# Patient Record
Sex: Male | Born: 1950
Health system: Southern US, Community
[De-identification: ages and names within clinical notes are randomized; demographics above are authoritative.]

## PROBLEM LIST (undated history)

## (undated) DIAGNOSIS — K219 Gastro-esophageal reflux disease without esophagitis: Secondary | ICD-10-CM

## (undated) DIAGNOSIS — E785 Hyperlipidemia, unspecified: Secondary | ICD-10-CM

## (undated) HISTORY — PX: OTHER SURGICAL HISTORY: SHX169

## (undated) HISTORY — PX: ANTERIOR CRUCIATE LIGAMENT REPAIR: SHX115

## (undated) HISTORY — PX: ABDOMINAL SURGERY: SHX537

## (undated) HISTORY — PX: KNEE ARTHROSCOPY: SHX127

---

## 2009-07-28 ENCOUNTER — Ambulatory Visit: Admission: RE | Admit: 2009-07-28 | Discharge: 2009-07-28 | Payer: Self-pay | Admitting: Orthopedic Surgery

## 2009-07-28 ENCOUNTER — Encounter (INDEPENDENT_AMBULATORY_CARE_PROVIDER_SITE_OTHER): Payer: Self-pay | Admitting: Orthopedic Surgery

## 2009-07-28 ENCOUNTER — Ambulatory Visit: Payer: Self-pay | Admitting: Vascular Surgery

## 2013-01-03 ENCOUNTER — Ambulatory Visit (INDEPENDENT_AMBULATORY_CARE_PROVIDER_SITE_OTHER): Payer: BC Managed Care – PPO | Admitting: Surgery

## 2013-01-03 ENCOUNTER — Encounter (INDEPENDENT_AMBULATORY_CARE_PROVIDER_SITE_OTHER): Payer: Self-pay | Admitting: Surgery

## 2013-01-03 VITALS — BP 130/78 | HR 52 | Temp 98.3°F | Resp 14 | Ht 71.0 in | Wt 168.8 lb

## 2013-01-03 DIAGNOSIS — K4090 Unilateral inguinal hernia, without obstruction or gangrene, not specified as recurrent: Secondary | ICD-10-CM | POA: Insufficient documentation

## 2013-01-03 NOTE — Patient Instructions (Signed)

## 2013-01-03 NOTE — Progress Notes (Signed)
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  He said that he had A. Boles down near when he was in high school and they fix something on his bowel. The incision is Consistent with a right inguinal hernia incision. Examination suggested a broad-based right direct inguinal hernia. He had a traumatic soccer injury and infarcted his left testicle. This was in a soccer game.  I discussed laparoscopic as well as open inguinal hernia repair with him. 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  He needs to schedule this at a time when it's not busy for him and he was to get it done in August.  History reviewed. No pertinent past medical history.  Past Surgical History  Procedure Laterality Date  . Knee arthroscopy      rt  . Abdominal surgery      Current Outpatient Prescriptions  Medication Sig Dispense Refill  . atorvastatin (LIPITOR) 20 MG tablet Take 20 mg by mouth daily.      Marland Kitchen omeprazole (PRILOSEC) 20 MG capsule Take 20 mg by mouth daily.      . vitamin B-12 (CYANOCOBALAMIN) 1000 MCG tablet Take 1,000 mcg by mouth daily.       No current facility-administered medications for this visit.   Review of patient's allergies indicates not on file. History reviewed. No pertinent family history. Social History:   reports that he has never smoked. He does not have any smokeless tobacco history on file. He reports that he drinks about 0.6 ounces of alcohol per week. He reports that he does not use illicit drugs.   REVIEW OF SYSTEMS - PERTINENT POSITIVES ONLY: Noncontributory`  Physical Exam:   There were no vitals taken for this visit. There is no height or weight on file to calculate BMI.  Gen:  WDWN WM NAD  Neurological: Alert and oriented to person, place, and time. Motor and sensory function is grossly  intact  Head: Normocephalic and atraumatic.  Eyes: Conjunctivae are normal. Pupils are equal, round, and reactive to light. No scleral icterus.  Neck: Normal range of motion. Neck supple. No tracheal deviation or thyromegaly present.  Cardiovascular:  SR without murmurs or gallops.  No carotid bruits Respiratory: Effort normal.  No respiratory distress. No chest wall tenderness. Breath sounds normal.  No wheezes, rales or rhonchi.  Abdomen:  RIH recurrent;  No LIH;  Atrophic left testis GU: Musculoskeletal: Normal range of motion. Extremities are nontender. No cyanosis, edema or clubbing noted Lymphadenopathy: No cervical, preauricular, postauricular or axillary adenopathy is present Skin: Skin is warm and dry. No rash noted. No diaphoresis. No erythema. No pallor. Pscyh: Normal mood and affect. Behavior is normal. Judgment and thought content normal.   LABORATORY RESULTS: No results found for this or any previous visit (from the past 48 hour(s)).  RADIOLOGY RESULTS: No results found.  Problem List: There are no active problems to display for this patient.   Assessment & Plan: Recurrent RIH;  Plan open right inguinal hernia repair at [facility] B. Daphine Deutscher, MD, [facility] [phone] beeper [phone]  01/03/2013 4:20 PM

## 2013-03-14 ENCOUNTER — Encounter (HOSPITAL_COMMUNITY): Payer: Self-pay | Admitting: Pharmacy Technician

## 2013-03-19 NOTE — Patient Instructions (Signed)
Matthew Becker  03/19/2013   Your procedure is scheduled on:  03/23/13              Surgery 0730am-0905am  Report to [facility] at     0530  AM.  Call this number if you have problems the morning of surgery: [phone]   Remember: Fleets enema nite before surgery.     Do not eat food or drink liquids after midnight.   Take these medicines the morning of surgery with A SIP OF WATER:    Do not wear jewelry,   Do not wear lotions, powders, or perfumes. .   Men may shave face and neck.  Do not bring valuables to the hospital.  Contacts, dentures or bridgework may not be worn into surgery.     Patients discharged the day of surgery will not be allowed to drive  home.  Name and phone number of your driver:    SEE CHG INSTRUCTION SHEET    Please read over the following fact sheets that you were given: , coughing and deep breathing exercises, leg exercises               Failure to comply with these instructions may result in cancellation of your surgery.                Patient Signature ____________________________              Nurse Signature _____________________________

## 2013-03-20 ENCOUNTER — Encounter (HOSPITAL_COMMUNITY): Payer: Self-pay

## 2013-03-20 ENCOUNTER — Encounter (HOSPITAL_COMMUNITY)
Admission: RE | Admit: 2013-03-20 | Discharge: 2013-03-20 | Disposition: A | Discharge status: Home / Self Care | Payer: BC Managed Care – PPO | Source: Ambulatory Visit | Attending: Surgery | Admitting: Surgery

## 2013-03-20 HISTORY — DX: Gastro-esophageal reflux disease without esophagitis: K21.9

## 2013-03-20 LAB — CBC
Hemoglobin: 15.9 g/dL (ref 13.0–17.0)
MCH: 33.2 pg (ref 26.0–34.0)
MCHC: 34.6 g/dL (ref 30.0–36.0)
Platelets: 199 10*3/uL (ref 150–400)
RDW: 12.2 % (ref 11.5–15.5)

## 2013-03-22 NOTE — H&P (Signed)
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 He said that he had a bulge down near there  when he was in high school and they fixed something on his bowel. The incision is consistent with a right inguinal hernia incision. Examination suggested a broad-based right direct inguinal hernia. He had a traumatic soccer injury and infarcted his left testicle. This was in a soccer game.  I discussed laparoscopic as well as open inguinal hernia repair with him. 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 He needs to schedule this at a time when it's not busy for him and he was to get it done in August.  History reviewed. No pertinent past medical history.  Past Surgical History   Procedure  Laterality  Date   .  Knee arthroscopy       rt   .  Abdominal surgery      Current Outpatient Prescriptions   Medication  Sig  Dispense  Refill   .  atorvastatin (LIPITOR) 20 MG tablet  Take 20 mg by mouth daily.     Marland Kitchen  omeprazole (PRILOSEC) 20 MG capsule  Take 20 mg by mouth daily.     .  vitamin B-12 (CYANOCOBALAMIN) 1000 MCG tablet  Take 1,000 mcg by mouth daily.      No current facility-administered medications for this visit.   Review of patient's allergies indicates not on file.  History reviewed. No pertinent family history.  Social History: reports that he has never smoked. He does not have any smokeless tobacco history on file. He reports that he drinks about 0.6 ounces of alcohol per week. He reports that he does not use illicit drugs.  REVIEW OF SYSTEMS - PERTINENT POSITIVES ONLY:  Noncontributory`  Physical Exam:  There were no vitals taken for this visit.  There is no height or weight on file to calculate BMI.  Gen: WDWN WM NAD  Neurological: Alert and oriented to person, place, and time. Motor and sensory function is  grossly intact  Head: Normocephalic and atraumatic.  Eyes: Conjunctivae are normal. Pupils are equal, round, and reactive to light. No scleral icterus.  Neck: Normal range of motion. Neck supple. No tracheal deviation or thyromegaly present.  Cardiovascular: SR without murmurs or gallops. No carotid bruits  Respiratory: Effort normal. No respiratory distress. No chest wall tenderness. Breath sounds normal. No wheezes, rales or rhonchi.  Abdomen: RIH recurrent; No LIH; Atrophic left testis  GU:  Musculoskeletal: Normal range of motion. Extremities are nontender. No cyanosis, edema or clubbing noted Lymphadenopathy: No cervical, preauricular, postauricular or axillary adenopathy is present Skin: Skin is warm and dry. No rash noted. No diaphoresis. No erythema. No pallor. Pscyh: Normal mood and affect. Behavior is normal. Judgment and thought content normal.  LABORATORY RESULTS:  No results found for this or any previous visit (from the past 48 hour(s)).  RADIOLOGY RESULTS:  No results found.  Problem List:  There are no active problems to display for this patient.  Assessment & Plan:  Recurrent RIH; Plan open right inguinal hernia repair at [facility] B. Daphine Deutscher, MD, [facility]  [phone] beeper  [phone]

## 2013-03-23 ENCOUNTER — Encounter (HOSPITAL_COMMUNITY)
Admission: RE | Disposition: A | Discharge status: Home / Self Care | Payer: Self-pay | Source: Ambulatory Visit | Attending: Surgery

## 2013-03-23 ENCOUNTER — Ambulatory Visit (HOSPITAL_COMMUNITY)
Admission: RE | Admit: 2013-03-23 | Discharge: 2013-03-23 | Disposition: A | Discharge status: Home / Self Care | Payer: BC Managed Care – PPO | Source: Ambulatory Visit | Attending: Surgery | Admitting: Surgery

## 2013-03-23 ENCOUNTER — Encounter (HOSPITAL_COMMUNITY): Payer: Self-pay | Admitting: Anesthesiology

## 2013-03-23 ENCOUNTER — Ambulatory Visit (HOSPITAL_COMMUNITY): Payer: BC Managed Care – PPO | Admitting: Anesthesiology

## 2013-03-23 ENCOUNTER — Encounter (HOSPITAL_COMMUNITY): Payer: Self-pay | Admitting: *Deleted

## 2013-03-23 DIAGNOSIS — K4090 Unilateral inguinal hernia, without obstruction or gangrene, not specified as recurrent: Secondary | ICD-10-CM

## 2013-03-23 DIAGNOSIS — K219 Gastro-esophageal reflux disease without esophagitis: Secondary | ICD-10-CM | POA: Insufficient documentation

## 2013-03-23 DIAGNOSIS — K4091 Unilateral inguinal hernia, without obstruction or gangrene, recurrent: Secondary | ICD-10-CM | POA: Insufficient documentation

## 2013-03-23 DIAGNOSIS — Z79899 Other long term (current) drug therapy: Secondary | ICD-10-CM | POA: Insufficient documentation

## 2013-03-23 HISTORY — PX: INGUINAL HERNIA REPAIR: SHX194

## 2013-03-23 SURGERY — REPAIR, HERNIA, INGUINAL, ADULT
Anesthesia: General | Site: Groin | Laterality: Right | Wound class: Clean

## 2013-03-23 MED ORDER — LACTATED RINGERS IV SOLN: INTRAVENOUS | Status: DC

## 2013-03-23 MED ORDER — GLYCOPYRROLATE 0.2 MG/ML IJ SOLN
INTRAMUSCULAR | Status: DC | PRN
  Administered 2013-03-23: 0.4 mg via INTRAVENOUS

## 2013-03-23 MED ORDER — KETAMINE HCL 10 MG/ML IJ SOLN
INTRAMUSCULAR | Status: DC | PRN
  Administered 2013-03-23: 10 mg via INTRAVENOUS

## 2013-03-23 MED ORDER — ONDANSETRON HCL 4 MG/2ML IJ SOLN: 4.0000 mg | Freq: Four times a day (QID) | INTRAMUSCULAR | Status: DC | PRN

## 2013-03-23 MED ORDER — CHLORHEXIDINE GLUCONATE 4 % EX LIQD
1.0000 "application " | Freq: Once | CUTANEOUS | Status: DC
  Filled 2013-03-23: qty 15

## 2013-03-23 MED ORDER — PROMETHAZINE HCL 25 MG/ML IJ SOLN: 6.2500 mg | INTRAMUSCULAR | Status: DC | PRN

## 2013-03-23 MED ORDER — DEXAMETHASONE SODIUM PHOSPHATE 4 MG/ML IJ SOLN
INTRAMUSCULAR | Status: DC | PRN
  Administered 2013-03-23: 8 mg via INTRAVENOUS

## 2013-03-23 MED ORDER — FENTANYL CITRATE 0.05 MG/ML IJ SOLN
INTRAMUSCULAR | Status: DC | PRN
  Administered 2013-03-23: 75 ug via INTRAVENOUS
  Administered 2013-03-23: 50 ug via INTRAVENOUS
  Administered 2013-03-23 (×2): 25 ug via INTRAVENOUS

## 2013-03-23 MED ORDER — BUPIVACAINE LIPOSOME 1.3 % IJ SUSP
20.0000 mL | Freq: Once | INTRAMUSCULAR | Status: DC
  Filled 2013-03-23: qty 20

## 2013-03-23 MED ORDER — OXYCODONE HCL 5 MG PO TABS: 5.0000 mg | ORAL_TABLET | ORAL | Status: DC | PRN

## 2013-03-23 MED ORDER — CEFAZOLIN SODIUM-DEXTROSE 2-3 GM-% IV SOLR: 2.0000 g | INTRAVENOUS | Status: DC

## 2013-03-23 MED ORDER — SODIUM CHLORIDE 0.9 % IV SOLN: 250.0000 mL | INTRAVENOUS | Status: DC | PRN

## 2013-03-23 MED ORDER — FENTANYL CITRATE 0.05 MG/ML IJ SOLN: 25.0000 ug | INTRAMUSCULAR | Status: DC | PRN

## 2013-03-23 MED ORDER — MEPERIDINE HCL 50 MG/ML IJ SOLN: 6.2500 mg | INTRAMUSCULAR | Status: DC | PRN

## 2013-03-23 MED ORDER — SUCCINYLCHOLINE CHLORIDE 20 MG/ML IJ SOLN
INTRAMUSCULAR | Status: DC | PRN
  Administered 2013-03-23: 100 mg via INTRAVENOUS

## 2013-03-23 MED ORDER — CISATRACURIUM BESYLATE (PF) 10 MG/5ML IV SOLN
INTRAVENOUS | Status: DC | PRN
  Administered 2013-03-23: 8 mg via INTRAVENOUS

## 2013-03-23 MED ORDER — MIDAZOLAM HCL 5 MG/5ML IJ SOLN
INTRAMUSCULAR | Status: DC | PRN
  Administered 2013-03-23: 1 mg via INTRAVENOUS

## 2013-03-23 MED ORDER — ACETAMINOPHEN 650 MG RE SUPP
650.0000 mg | RECTAL | Status: DC | PRN
  Filled 2013-03-23: qty 1

## 2013-03-23 MED ORDER — LACTATED RINGERS IV SOLN
INTRAVENOUS | Status: DC | PRN
  Administered 2013-03-23 (×2): via INTRAVENOUS

## 2013-03-23 MED ORDER — OXYCODONE-ACETAMINOPHEN 5-325 MG PO TABS: 1.0000 | ORAL_TABLET | ORAL | Status: DC | PRN

## 2013-03-23 MED ORDER — CEFAZOLIN SODIUM-DEXTROSE 2-3 GM-% IV SOLR
INTRAVENOUS | Status: AC
  Filled 2013-03-23: qty 50

## 2013-03-23 MED ORDER — HEPARIN SODIUM (PORCINE) 5000 UNIT/ML IJ SOLN
5000.0000 [IU] | Freq: Once | INTRAMUSCULAR | Status: AC
  Administered 2013-03-23: 5000 [IU] via SUBCUTANEOUS
  Filled 2013-03-23: qty 1

## 2013-03-23 MED ORDER — ONDANSETRON HCL 4 MG/2ML IJ SOLN
INTRAMUSCULAR | Status: DC | PRN
  Administered 2013-03-23 (×2): 2 mg via INTRAVENOUS

## 2013-03-23 MED ORDER — NEOSTIGMINE METHYLSULFATE 1 MG/ML IJ SOLN
INTRAMUSCULAR | Status: DC | PRN
  Administered 2013-03-23: 3 mg via INTRAVENOUS

## 2013-03-23 MED ORDER — PROPOFOL INFUSION 10 MG/ML OPTIME
INTRAVENOUS | Status: DC | PRN
  Administered 2013-03-23: 10 mL via INTRAVENOUS

## 2013-03-23 MED ORDER — PROPOFOL 10 MG/ML IV BOLUS
INTRAVENOUS | Status: DC | PRN
  Administered 2013-03-23: 175 mg via INTRAVENOUS

## 2013-03-23 MED ORDER — LIDOCAINE HCL (CARDIAC) 20 MG/ML IV SOLN
INTRAVENOUS | Status: DC | PRN
  Administered 2013-03-23: 30 mg via INTRAVENOUS

## 2013-03-23 MED ORDER — SODIUM CHLORIDE 0.9 % IJ SOLN: 3.0000 mL | INTRAMUSCULAR | Status: DC | PRN

## 2013-03-23 MED ORDER — BUPIVACAINE LIPOSOME 1.3 % IJ SUSP
INTRAMUSCULAR | Status: DC | PRN
  Administered 2013-03-23: 20 mL

## 2013-03-23 MED ORDER — ACETAMINOPHEN 325 MG PO TABS: 650.0000 mg | ORAL_TABLET | ORAL | Status: DC | PRN

## 2013-03-23 MED ORDER — EPHEDRINE SULFATE 50 MG/ML IJ SOLN
INTRAMUSCULAR | Status: DC | PRN
  Administered 2013-03-23: 10 mg via INTRAVENOUS

## 2013-03-23 MED ORDER — SODIUM CHLORIDE 0.9 % IJ SOLN: 3.0000 mL | Freq: Two times a day (BID) | INTRAMUSCULAR | Status: DC

## 2013-03-23 SURGICAL SUPPLY — 42 items
BENZOIN TINCTURE PRP APPL 2/3 (GAUZE/BANDAGES/DRESSINGS) ×2 IMPLANT
BLADE HEX COATED 2.75 (ELECTRODE) ×2 IMPLANT
BLADE SURG 15 STRL LF DISP TIS (BLADE) ×1 IMPLANT
BLADE SURG 15 STRL SS (BLADE) ×1
BLADE SURG SZ10 CARB STEEL (BLADE) ×2 IMPLANT
CANISTER SUCTION 2500CC (MISCELLANEOUS) ×2 IMPLANT
CLOTH BEACON ORANGE TIMEOUT ST (SAFETY) ×2 IMPLANT
DECANTER SPIKE VIAL GLASS SM (MISCELLANEOUS) ×2 IMPLANT
DERMABOND ADVANCED (GAUZE/BANDAGES/DRESSINGS) ×1
DERMABOND ADVANCED .7 DNX12 (GAUZE/BANDAGES/DRESSINGS) ×1 IMPLANT
DISSECTOR ROUND CHERRY 3/8 STR (MISCELLANEOUS) ×2 IMPLANT
DRAIN PENROSE 18X1/2 LTX STRL (DRAIN) ×2 IMPLANT
DRAPE LAPAROTOMY TRNSV 102X78 (DRAPE) ×2 IMPLANT
DRSG TEGADERM 2-3/8X2-3/4 SM (GAUZE/BANDAGES/DRESSINGS) IMPLANT
ELECT REM PT RETURN 9FT ADLT (ELECTROSURGICAL) ×2
ELECTRODE REM PT RTRN 9FT ADLT (ELECTROSURGICAL) ×1 IMPLANT
GAUZE SPONGE 4X4 16PLY XRAY LF (GAUZE/BANDAGES/DRESSINGS) IMPLANT
GLOVE BIOGEL M 8.0 STRL (GLOVE) ×2 IMPLANT
GLOVE BIOGEL PI IND STRL 7.0 (GLOVE) ×1 IMPLANT
GLOVE BIOGEL PI INDICATOR 7.0 (GLOVE) ×1
GOWN STRL NON-REIN LRG LVL3 (GOWN DISPOSABLE) ×2 IMPLANT
GOWN STRL REIN XL XLG (GOWN DISPOSABLE) ×4 IMPLANT
KIT BASIN OR (CUSTOM PROCEDURE TRAY) ×2 IMPLANT
MESH HERNIA 3X6 (Mesh General) ×2 IMPLANT
NEEDLE HYPO 25X1 1.5 SAFETY (NEEDLE) ×2 IMPLANT
NS IRRIG 1000ML POUR BTL (IV SOLUTION) ×2 IMPLANT
PACK BASIC VI WITH GOWN DISP (CUSTOM PROCEDURE TRAY) ×2 IMPLANT
PENCIL BUTTON HOLSTER BLD 10FT (ELECTRODE) ×2 IMPLANT
SPONGE GAUZE 4X4 12PLY (GAUZE/BANDAGES/DRESSINGS) IMPLANT
SPONGE LAP 4X18 X RAY DECT (DISPOSABLE) ×4 IMPLANT
STAPLER VISISTAT 35W (STAPLE) IMPLANT
STRIP CLOSURE SKIN 1/2X4 (GAUZE/BANDAGES/DRESSINGS) ×2 IMPLANT
SUT PROLENE 2 0 CT2 30 (SUTURE) ×4 IMPLANT
SUT SILK 2 0 SH (SUTURE) ×2 IMPLANT
SUT SILK 2 0 SH CR/8 (SUTURE) IMPLANT
SUT SURGILON 0 BLK (SUTURE) IMPLANT
SUT VIC AB 2-0 SH 27 (SUTURE) ×2
SUT VIC AB 2-0 SH 27X BRD (SUTURE) ×2 IMPLANT
SUT VIC AB 4-0 SH 18 (SUTURE) ×2 IMPLANT
SYR BULB IRRIGATION 50ML (SYRINGE) ×2 IMPLANT
SYR CONTROL 10ML LL (SYRINGE) ×2 IMPLANT
YANKAUER SUCT BULB TIP 10FT TU (MISCELLANEOUS) ×2 IMPLANT

## 2013-03-23 NOTE — Anesthesia Preprocedure Evaluation (Addendum)
Anesthesia Evaluation  Patient identified by MRN, date of birth, ID band Patient awake    Reviewed: Allergy & Precautions, H&P , NPO status , Patient's Chart, lab work & pertinent test results  Airway Mallampati: II TM Distance: >3 FB Neck ROM: Full    Dental no notable dental hx.    Pulmonary neg pulmonary ROS,  breath sounds clear to auscultation  Pulmonary exam normal       Cardiovascular negative cardio ROS  Rhythm:Regular Rate:Normal     Neuro/Psych negative neurological ROS  negative psych ROS   GI/Hepatic Neg liver ROS, GERD-  Medicated and Controlled,  Endo/Other  negative endocrine ROS  Renal/GU negative Renal ROS  negative genitourinary   Musculoskeletal negative musculoskeletal ROS (+)   Abdominal   Peds negative pediatric ROS (+)  Hematology negative hematology ROS (+)   Anesthesia Other Findings   Reproductive/Obstetrics negative OB ROS                           Anesthesia Physical Anesthesia Plan  ASA: I  Anesthesia Plan: General   Post-op Pain Management:    Induction: Intravenous  Airway Management Planned: LMA and Oral ETT  Additional Equipment:   Intra-op Plan:   Post-operative Plan: Extubation in OR  Informed Consent: I have reviewed the patients History and Physical, chart, labs and discussed the procedure including the risks, benefits and alternatives for the proposed anesthesia with the patient or authorized representative who has indicated his/her understanding and acceptance.   Dental advisory given  Plan Discussed with: CRNA  Anesthesia Plan Comments:         Anesthesia Quick Evaluation

## 2013-03-23 NOTE — Interval H&P Note (Signed)
History and Physical Interval Note:  03/23/2013 7:30 AM  Matthew Becker  has presented today for surgery, with the diagnosis of  right inguinal hernia  The various methods of treatment have been discussed with the patient and family. After consideration of risks, benefits and other options for treatment, the patient has consented to  Procedure(s): HERNIA REPAIR INGUINAL ADULT (Right) as a surgical intervention .  The patient's history has been reviewed, patient examined, no change in status, stable for surgery.  I have reviewed the patient's chart and labs.  Questions were answered to the patient's satisfaction.     Lakecia Deschamps B

## 2013-03-23 NOTE — Transfer of Care (Signed)
Immediate Anesthesia Transfer of Care Note  Patient: Matthew Becker  Procedure(s) Performed: Procedure(s): HERNIA REPAIR INGUINAL ADULT (Right)  Patient Location: PACU  Anesthesia Type:General  Level of Consciousness: awake, alert , oriented and patient cooperative  Airway & Oxygen Therapy: Patient Spontanous Breathing and Patient connected to nasal cannula oxygen  Post-op Assessment: Report given to PACU RN and Post -op Vital signs reviewed and stable  Post vital signs: stable  Complications: No apparent anesthesia complications

## 2013-03-23 NOTE — Op Note (Signed)
Surgeon: Wenda Low, MD, FACS  Asst:  none  Anes:  general  Procedure: Open right inguinal hernia repair with marlex type mesh repair of the floor  Diagnosis: Large right direct inguinal hernia  Complications: none  EBL:   minimal cc  Description of Procedure:  The patient was taken to room 11 at [facility] and given general anesthesia.  The patient had a recurrent RIH and this side was marked and after prepping with PCMX, a timeout was performed and the medial portion of the old incision was excised.  The dissection was carried down to the scarred external fascia which appeared to have been closed (not a Halstead II repair).  The hernia was bulging out of the external ring.  The fascia was mobilized and opened along the fibers to enable the cord vessels and vas to be separated from the hernia.  A narrow neck lipoma of the cord was ligated and excised.  The bulging direct hernia was tacked to reduce its presence in the floor and this enabled me to cut a piece of marlex type mesh to fit the defect and sew it with 2-0 prolene along the inguinal ligament and medially along the fascia of the internal oblique muscle.  The mesh was split to go around the cord structures and a single horizontal mattress suture was used to tack the two limbs of mesh together and tuck them beneath the external oblique.  Exparel 20 cc was injected beneath the fascia and then the ext oblique was closed with running 2-0 vicryl.  The incision was then closed in layers with 4-0 vicryl and 5-0 monocryl and Dermabond.    Matt B. Daphine Deutscher, MD, [facility], [place] [phone]

## 2013-03-23 NOTE — Anesthesia Postprocedure Evaluation (Signed)
  Anesthesia Post-op Note  Patient: Matthew Becker  Procedure(s) Performed: Procedure(s) (LRB): HERNIA REPAIR INGUINAL ADULT (Right)  Patient Location: PACU  Anesthesia Type: General  Level of Consciousness: awake and alert   Airway and Oxygen Therapy: Patient Spontanous Breathing  Post-op Pain: mild  Post-op Assessment: Post-op Vital signs reviewed, Patient's Cardiovascular Status Stable, Respiratory Function Stable, Patent Airway and No signs of Nausea or vomiting  Last Vitals:  Filed Vitals:   03/23/13 1200  BP: 138/78  Pulse: 55  Temp: 36.1 C  Resp: 16    Post-op Vital Signs: stable   Complications: No apparent anesthesia complications

## 2013-03-26 ENCOUNTER — Telehealth (INDEPENDENT_AMBULATORY_CARE_PROVIDER_SITE_OTHER): Payer: Self-pay | Admitting: General Surgery

## 2013-03-26 ENCOUNTER — Encounter (HOSPITAL_COMMUNITY): Payer: Self-pay | Admitting: Surgery

## 2013-03-26 NOTE — Telephone Encounter (Signed)
Spoke with pt and informed him that he has a po appt w/ Dr. Daphine Deutscher on 04/18/13 at 12:00 w/ arrival time of 11:45

## 2013-04-12 ENCOUNTER — Encounter (INDEPENDENT_AMBULATORY_CARE_PROVIDER_SITE_OTHER): Payer: BC Managed Care – PPO | Admitting: Surgery

## 2013-04-18 ENCOUNTER — Encounter (INDEPENDENT_AMBULATORY_CARE_PROVIDER_SITE_OTHER): Payer: Self-pay | Admitting: Surgery

## 2013-04-18 ENCOUNTER — Ambulatory Visit (INDEPENDENT_AMBULATORY_CARE_PROVIDER_SITE_OTHER): Payer: BC Managed Care – PPO | Admitting: Surgery

## 2013-04-18 VITALS — BP 128/80 | HR 68 | Resp 14 | Ht 71.0 in | Wt 173.0 lb

## 2013-04-18 DIAGNOSIS — Z8719 Personal history of other diseases of the digestive system: Secondary | ICD-10-CM | POA: Insufficient documentation

## 2013-04-18 NOTE — Progress Notes (Signed)
Irven Shelling 62 y.o.  Body mass index is 24.14 kg/(m^2).  There are no active problems to display for this patient.   No Known Allergies  Past Surgical History  Procedure Laterality Date  . Knee arthroscopy      rt  . Abdominal surgery    . Anterior cruciate ligament repair      1974  . Abdominal abcess surgery    . Inguinal hernia repair Right 03/23/2013    Procedure: HERNIA REPAIR INGUINAL ADULT;  Surgeon: Valarie Merino, MD;  Location: [facility];  Service: General;  Laterality: Right;  With Mesh   Lillia Mountain, MD No diagnosis found.  Incision has healed OK.  No evidence of recurrent hernia.  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   Return prn.  Matt B. Daphine Deutscher, MD, [facility] [phone] beeper [phone]  04/18/2013 12:39 PM

## 2013-04-18 NOTE — Patient Instructions (Addendum)
Thanks for your patience.  If you need further assistance after leaving the office, please call our office and speak with a [facility] nurse.  [phone].  If you want to leave a message for Dr. Miaisabella Bacorn, please call his office phone at [phone]. 

## 2015-06-05 ENCOUNTER — Other Ambulatory Visit: Payer: Self-pay | Admitting: Orthopaedic Surgery

## 2015-06-25 ENCOUNTER — Other Ambulatory Visit: Payer: Self-pay

## 2015-06-25 ENCOUNTER — Ambulatory Visit (HOSPITAL_COMMUNITY)
Admission: RE | Admit: 2015-06-25 | Discharge: 2015-06-25 | Disposition: A | Discharge status: Home / Self Care | Payer: Medicare Other | Source: Ambulatory Visit | Attending: Orthopaedic Surgery | Admitting: Orthopaedic Surgery

## 2015-06-25 ENCOUNTER — Encounter (HOSPITAL_COMMUNITY)
Admission: RE | Admit: 2015-06-25 | Discharge: 2015-06-25 | Disposition: A | Discharge status: Home / Self Care | Payer: Medicare Other | Source: Ambulatory Visit | Attending: Orthopaedic Surgery | Admitting: Orthopaedic Surgery

## 2015-06-25 DIAGNOSIS — K219 Gastro-esophageal reflux disease without esophagitis: Secondary | ICD-10-CM | POA: Diagnosis not present

## 2015-06-25 DIAGNOSIS — E785 Hyperlipidemia, unspecified: Secondary | ICD-10-CM | POA: Diagnosis not present

## 2015-06-25 DIAGNOSIS — Z01812 Encounter for preprocedural laboratory examination: Secondary | ICD-10-CM | POA: Diagnosis not present

## 2015-06-25 DIAGNOSIS — Z01818 Encounter for other preprocedural examination: Secondary | ICD-10-CM

## 2015-06-25 DIAGNOSIS — R001 Bradycardia, unspecified: Secondary | ICD-10-CM | POA: Diagnosis not present

## 2015-06-25 DIAGNOSIS — Z01810 Encounter for preprocedural cardiovascular examination: Secondary | ICD-10-CM | POA: Insufficient documentation

## 2015-06-25 DIAGNOSIS — R9431 Abnormal electrocardiogram [ECG] [EKG]: Secondary | ICD-10-CM | POA: Insufficient documentation

## 2015-06-25 DIAGNOSIS — Z79899 Other long term (current) drug therapy: Secondary | ICD-10-CM | POA: Insufficient documentation

## 2015-06-25 HISTORY — DX: Hyperlipidemia, unspecified: E78.5

## 2015-06-25 LAB — CBC WITH DIFFERENTIAL/PLATELET
BASOS ABS: 0 10*3/uL (ref 0.0–0.1)
Basophils Relative: 1 %
EOS PCT: 1 %
Eosinophils Absolute: 0.1 10*3/uL (ref 0.0–0.7)
HEMATOCRIT: 47.4 % (ref 39.0–52.0)
Hemoglobin: 16.3 g/dL (ref 13.0–17.0)
LYMPHS ABS: 1.5 10*3/uL (ref 0.7–4.0)
Lymphocytes Relative: 24 %
MCH: 33.3 pg (ref 26.0–34.0)
MCHC: 34.4 g/dL (ref 30.0–36.0)
MCV: 96.7 fL (ref 78.0–100.0)
MONOS PCT: 7 %
Monocytes Absolute: 0.5 10*3/uL (ref 0.1–1.0)
Neutro Abs: 4.2 10*3/uL (ref 1.7–7.7)
Neutrophils Relative %: 67 %
PLATELETS: 189 10*3/uL (ref 150–400)
RBC: 4.9 MIL/uL (ref 4.22–5.81)
RDW: 12.2 % (ref 11.5–15.5)
WBC: 6.3 10*3/uL (ref 4.0–10.5)

## 2015-06-25 LAB — SURGICAL PCR SCREEN
MRSA, PCR: NEGATIVE
STAPHYLOCOCCUS AUREUS: POSITIVE — AB

## 2015-06-25 LAB — TYPE AND SCREEN
ABO/RH(D): O POS
ANTIBODY SCREEN: NEGATIVE

## 2015-06-25 LAB — ABO/RH: ABO/RH(D): O POS

## 2015-06-25 LAB — URINALYSIS, ROUTINE W REFLEX MICROSCOPIC
Bilirubin Urine: NEGATIVE
GLUCOSE, UA: NEGATIVE mg/dL
HGB URINE DIPSTICK: NEGATIVE
KETONES UR: NEGATIVE mg/dL
LEUKOCYTES UA: NEGATIVE
Nitrite: NEGATIVE
PROTEIN: NEGATIVE mg/dL
Specific Gravity, Urine: 1.014 (ref 1.005–1.030)
UROBILINOGEN UA: 1 mg/dL (ref 0.0–1.0)
pH: 6 (ref 5.0–8.0)

## 2015-06-25 LAB — BASIC METABOLIC PANEL
Anion gap: 8 (ref 5–15)
BUN: 14 mg/dL (ref 6–20)
CHLORIDE: 107 mmol/L (ref 101–111)
CO2: 23 mmol/L (ref 22–32)
CREATININE: 0.85 mg/dL (ref 0.61–1.24)
Calcium: 9.2 mg/dL (ref 8.9–10.3)
GFR calc Af Amer: >60 mL/min (ref >60)
GFR calc non Af Amer: >60 mL/min (ref >60)
Glucose, Bld: 111 mg/dL — ABNORMAL HIGH (ref 65–99)
Potassium: 4.1 mmol/L (ref 3.5–5.1)
Sodium: 138 mmol/L (ref 135–145)

## 2015-06-25 LAB — PROTIME-INR
INR: 1.05 (ref 0.00–1.49)
Prothrombin Time: 13.9 seconds (ref 11.6–15.2)

## 2015-06-25 LAB — APTT: aPTT: 29 seconds (ref 24–37)

## 2015-06-25 NOTE — Progress Notes (Signed)
Pt notified PCR results (staph pos, MRSA neg).  Mupirocin Ointment 2% called into [facility]. Pt instructed to begin treatment.

## 2015-06-25 NOTE — Pre-Procedure Instructions (Signed)
    Ahkeem Goede  06/25/2015      [facility] - [place], [place] [address] Phone: [phone] Fax: [phone]    Your procedure is scheduled on Tuesday July 08, 2015  Report to [facility] Admitting at  5:30 A.M.  Call this number if you have problems the morning of surgery:  [phone]   Remember:  Do not eat food or drink liquids after midnight.  Take these medicines the morning of surgery with A SIP OF WATER : omeprazole (prilosec).  DO NOT TAKE ANY VITAMIN OR HERBAL SUPPLEMENTS, FISH OILS, ASPIRIN PRODUCTS, GOODY'S BC'S, IBUPROFEN OR NAPROXEN CONTAINING PRODUCTS OR OTHER NSAIDS FIVE DAYS PRIOR TO SURGERY.   Do not wear jewelry.  Do not wear lotions, powders, or perfumes.  You may wear deodorant.  Do not shave the operative site (left leg) 48 hours prior to surgery.  Men may shave face and neck.  Do not bring valuables to the hospital.  [facility] is not responsible for any belongings or valuables.  Contacts, dentures or bridgework may not be worn into surgery.  Leave your suitcase in the car.  After surgery it may be brought to your room.  For patients admitted to the hospital, discharge time will be determined by your treatment team.  Patients discharged the day of surgery will not be allowed to drive home.    Please read over the following fact sheets that you were given. Pain Booklet, Coughing and Deep Breathing, Blood Transfusion Information, Total Joint Packet, MRSA Information and Surgical Site Infection Prevention

## 2015-06-26 ENCOUNTER — Encounter (HOSPITAL_COMMUNITY): Payer: Self-pay

## 2015-06-26 NOTE — Progress Notes (Signed)
Anesthesia Chart Review:  Pt is 64 year old male scheduled for L total knee arthroplasty with hardware removal on 07/08/2015 with Dr. Rhona Raider.   PCP is Dr. Lavone Orn.   PMH includes: hyperlipidemia, GERD. Never smoker. BMI 26. S/p R inguinal hernia repair 03/23/13.   Medications include: lipitor, prilosec.   Preoperative labs reviewed.    Chest x-ray 06/25/15 reviewed. Persistent mild pulmonary interstitial marking increase slightly more conspicuous than in the past. There is no alveolar pneumonia, CHF, nor other acute cardiopulmonary abnormality.  EKG 06/25/15: Sinus bradycardia (56 bpm). Cannot rule out Anterior infarct , age undetermined  If no changes, I anticipate pt can proceed with surgery as scheduled.   Willeen Cass, FNP-BC [facility]/Anesthesiology Phone: [phone] 06/26/2015 3:34 PM

## 2015-07-02 NOTE — H&P (Signed)
TOTAL KNEE ADMISSION H&P  Patient is being admitted for left total knee arthroplasty.  Subjective:  Chief Complaint:left knee pain.  HPI: Matthew Becker, 64 y.o. male, has a history of pain and functional disability in the left knee due to arthritis and has failed non-surgical conservative treatments for greater than 12 weeks to includeNSAID's and/or analgesics, corticosteriod injections, flexibility and strengthening excercises, supervised PT with diminished ADL's post treatment, use of assistive devices, weight reduction as appropriate and activity modification.  Onset of symptoms was gradual, starting 5 years ago with gradually worsening course since that time. The patient noted prior procedures on the knee to include  Open Surgery on the left knee(s).  Patient currently rates pain in the left knee(s) at 10 out of 10 with activity. Patient has night pain, worsening of pain with activity and weight bearing, pain that interferes with activities of daily living, crepitus and joint swelling.  Patient has evidence of subchondral cysts, subchondral sclerosis, periarticular osteophytes and joint space narrowing by imaging studies. There is no active infection.  Patient Active Problem List   Diagnosis Date Noted  . S/P right inguinal hernia repair 04/18/2013   Past Medical History  Diagnosis Date  . GERD (gastroesophageal reflux disease)   . Hyperlipidemia     Past Surgical History  Procedure Laterality Date  . Knee arthroscopy      rt  . Abdominal surgery    . Anterior cruciate ligament repair      1974  . Abdominal abcess surgery    . Inguinal hernia repair Right 03/23/2013    Procedure: HERNIA REPAIR INGUINAL ADULT;  Surgeon: Pedro Earls, MD;  Location: [facility];  Service: General;  Laterality: Right;  With Mesh    No prescriptions prior to admission   No Known Allergies  Social History  Substance Use Topics  . Smoking status: Never Smoker   . Smokeless tobacco: Never Used  .  Alcohol Use: 3.0 oz/week    5 Glasses of wine per week    No family history on file.   Review of Systems  Musculoskeletal: Positive for joint pain.       Left knee  All other systems reviewed and are negative.   Objective:  Physical Exam  Constitutional: He is oriented to person, place, and time. He appears well-developed and well-nourished.  HENT:  Head: Normocephalic and atraumatic.  Eyes: Conjunctivae are normal. Pupils are equal, round, and reactive to light.  Neck: Normal range of motion.  Cardiovascular: Normal rate and regular rhythm.   Respiratory: Effort normal.  Musculoskeletal:  Left knee has medial and lateral incisions.  His motion is from about 0-120.  There is some crepitation and medial joint line pain.  He has a moderate varus deformity.  Opposite knee has slightly greater motion and minimal deformity.  There is no crepitation there.  Hip motion is full on both sides and straight leg raise is negative.  Sensation and motor function are intact in his feet with palpable pulses on both sides.    Neurological: He is alert and oriented to person, place, and time.  Skin: Skin is warm and dry.  Psychiatric: He has a normal mood and affect. His behavior is normal. Judgment and thought content normal.    Vital signs in last 24 hours:    Labs:   Estimated body mass index is 24.14 kg/(m^2) as calculated from the following:   Height as of 04/18/13: 5\' 11"  (1.803 m).   Weight as of 04/18/13:  78.472 kg (173 lb).   Imaging Review Plain radiographs demonstrate severe degenerative joint disease of the left knee(s). The overall alignment isneutral. The bone quality appears to be good for age and reported activity level.  Assessment/Plan:  End stage traumatic arthritis, left knee   The patient history, physical examination, clinical judgment of the provider and imaging studies are consistent with end stage degenerative joint disease of the left knee(s) and total knee  arthroplasty is deemed medically necessary. The treatment options including medical management, injection therapy arthroscopy and arthroplasty were discussed at length. The risks and benefits of total knee arthroplasty were presented and reviewed. The risks due to aseptic loosening, infection, stiffness, patella tracking problems, thromboembolic complications and other imponderables were discussed. The patient acknowledged the explanation, agreed to proceed with the plan and consent was signed. Patient is being admitted for inpatient treatment for surgery, pain control, PT, OT, prophylactic antibiotics, VTE prophylaxis, progressive ambulation and ADL's and discharge planning. The patient is planning to be discharged home with home health services

## 2015-07-07 MED ORDER — CHLORHEXIDINE GLUCONATE 4 % EX LIQD: 60.0000 mL | Freq: Once | CUTANEOUS | Status: DC

## 2015-07-07 MED ORDER — LACTATED RINGERS IV SOLN: INTRAVENOUS | Status: DC

## 2015-07-07 MED ORDER — CEFAZOLIN SODIUM-DEXTROSE 2-3 GM-% IV SOLR
2.0000 g | INTRAVENOUS | Status: AC
  Administered 2015-07-08: 2 g via INTRAVENOUS
  Filled 2015-07-07: qty 50

## 2015-07-07 NOTE — Anesthesia Preprocedure Evaluation (Addendum)
Anesthesia Evaluation  Patient identified by MRN, date of birth, ID band Patient awake    Reviewed: Allergy & Precautions, NPO status , Patient's Chart, lab work & pertinent test results  History of Anesthesia Complications Negative for: history of anesthetic complications  Airway Mallampati: I  TM Distance: >3 FB Neck ROM: Full    Dental  (+) Dental Advisory Given   Pulmonary neg pulmonary ROS,    breath sounds clear to auscultation       Cardiovascular negative cardio ROS   Rhythm:Regular Rate:Normal     Neuro/Psych negative neurological ROS     GI/Hepatic Neg liver ROS, GERD  Medicated and Controlled,  Endo/Other  negative endocrine ROS  Renal/GU negative Renal ROS     Musculoskeletal  (+) Arthritis , Osteoarthritis,    Abdominal   Peds  Hematology negative hematology ROS (+)   Anesthesia Other Findings   Reproductive/Obstetrics                            Anesthesia Physical Anesthesia Plan  ASA: I  Anesthesia Plan: Spinal   Post-op Pain Management:    Induction:   Airway Management Planned: Simple Face Mask and Natural Airway  Additional Equipment:   Intra-op Plan:   Post-operative Plan:   Informed Consent: I have reviewed the patients History and Physical, chart, labs and discussed the procedure including the risks, benefits and alternatives for the proposed anesthesia with the patient or authorized representative who has indicated his/her understanding and acceptance.   Dental advisory given  Plan Discussed with: CRNA and Surgeon  Anesthesia Plan Comments: (Plan routine monitors, SAB)        Anesthesia Quick Evaluation

## 2015-07-08 ENCOUNTER — Inpatient Hospital Stay (HOSPITAL_COMMUNITY): Payer: Medicare Other | Admitting: Emergency Medicine

## 2015-07-08 ENCOUNTER — Encounter (HOSPITAL_COMMUNITY): Payer: Self-pay | Admitting: Surgery

## 2015-07-08 ENCOUNTER — Inpatient Hospital Stay (HOSPITAL_COMMUNITY): Payer: Medicare Other | Admitting: Anesthesiology

## 2015-07-08 ENCOUNTER — Encounter (HOSPITAL_COMMUNITY)
Admission: RE | Disposition: A | Discharge status: Home Health | Payer: Self-pay | Source: Ambulatory Visit | Attending: Orthopaedic Surgery

## 2015-07-08 ENCOUNTER — Inpatient Hospital Stay (HOSPITAL_COMMUNITY)
Admission: RE | Admit: 2015-07-08 | Discharge: 2015-07-09 | DRG: 470 | Disposition: A | Discharge status: Home Health | Payer: Medicare Other | Source: Ambulatory Visit | Attending: Orthopaedic Surgery | Admitting: Orthopaedic Surgery

## 2015-07-08 DIAGNOSIS — Z7982 Long term (current) use of aspirin: Secondary | ICD-10-CM

## 2015-07-08 DIAGNOSIS — Z7289 Other problems related to lifestyle: Secondary | ICD-10-CM

## 2015-07-08 DIAGNOSIS — M1710 Unilateral primary osteoarthritis, unspecified knee: Secondary | ICD-10-CM | POA: Diagnosis present

## 2015-07-08 DIAGNOSIS — K219 Gastro-esophageal reflux disease without esophagitis: Secondary | ICD-10-CM | POA: Diagnosis present

## 2015-07-08 DIAGNOSIS — E785 Hyperlipidemia, unspecified: Secondary | ICD-10-CM | POA: Diagnosis present

## 2015-07-08 DIAGNOSIS — M1712 Unilateral primary osteoarthritis, left knee: Secondary | ICD-10-CM | POA: Diagnosis present

## 2015-07-08 DIAGNOSIS — M21162 Varus deformity, not elsewhere classified, left knee: Secondary | ICD-10-CM | POA: Diagnosis present

## 2015-07-08 HISTORY — PX: TOTAL KNEE ARTHROPLASTY: SHX125

## 2015-07-08 SURGERY — ARTHROPLASTY, KNEE, TOTAL
Anesthesia: Spinal | Laterality: Left

## 2015-07-08 MED ORDER — BUPIVACAINE LIPOSOME 1.3 % IJ SUSP
INTRAMUSCULAR | Status: DC | PRN
  Administered 2015-07-08: 20 mL

## 2015-07-08 MED ORDER — ONDANSETRON HCL 4 MG/2ML IJ SOLN
4.0000 mg | Freq: Four times a day (QID) | INTRAMUSCULAR | Status: DC | PRN
  Administered 2015-07-09: 4 mg via INTRAVENOUS
  Filled 2015-07-08: qty 2

## 2015-07-08 MED ORDER — METHOCARBAMOL 1000 MG/10ML IJ SOLN
500.0000 mg | Freq: Four times a day (QID) | INTRAVENOUS | Status: DC | PRN
  Filled 2015-07-08: qty 5

## 2015-07-08 MED ORDER — HYDROMORPHONE HCL 1 MG/ML IJ SOLN
0.5000 mg | INTRAMUSCULAR | Status: DC | PRN
  Administered 2015-07-08 – 2015-07-09 (×3): 1 mg via INTRAVENOUS
  Filled 2015-07-08 (×3): qty 1

## 2015-07-08 MED ORDER — FENTANYL CITRATE (PF) 100 MCG/2ML IJ SOLN
INTRAMUSCULAR | Status: DC | PRN
  Administered 2015-07-08 (×5): 50 ug via INTRAVENOUS

## 2015-07-08 MED ORDER — DIPHENHYDRAMINE HCL 12.5 MG/5ML PO ELIX: 12.5000 mg | ORAL_SOLUTION | ORAL | Status: DC | PRN

## 2015-07-08 MED ORDER — LIDOCAINE HCL (CARDIAC) 20 MG/ML IV SOLN
INTRAVENOUS | Status: AC
  Filled 2015-07-08: qty 10

## 2015-07-08 MED ORDER — PROPOFOL 10 MG/ML IV BOLUS
INTRAVENOUS | Status: AC
  Filled 2015-07-08: qty 20

## 2015-07-08 MED ORDER — BUPIVACAINE-EPINEPHRINE (PF) 0.25% -1:200000 IJ SOLN
INTRAMUSCULAR | Status: DC | PRN
  Administered 2015-07-08: 20 mL via PERINEURAL

## 2015-07-08 MED ORDER — BISACODYL 5 MG PO TBEC: 5.0000 mg | DELAYED_RELEASE_TABLET | Freq: Every day | ORAL | Status: DC | PRN

## 2015-07-08 MED ORDER — SODIUM CHLORIDE 0.9 % IR SOLN
Status: DC | PRN
  Administered 2015-07-08: 1000 mL

## 2015-07-08 MED ORDER — ROCURONIUM BROMIDE 50 MG/5ML IV SOLN
INTRAVENOUS | Status: AC
  Filled 2015-07-08: qty 1

## 2015-07-08 MED ORDER — FENTANYL CITRATE (PF) 250 MCG/5ML IJ SOLN
INTRAMUSCULAR | Status: AC
  Filled 2015-07-08: qty 5

## 2015-07-08 MED ORDER — MIDAZOLAM HCL 2 MG/2ML IJ SOLN: 0.5000 mg | Freq: Once | INTRAMUSCULAR | Status: DC | PRN

## 2015-07-08 MED ORDER — CEFAZOLIN SODIUM-DEXTROSE 2-3 GM-% IV SOLR
2.0000 g | Freq: Four times a day (QID) | INTRAVENOUS | Status: AC
  Administered 2015-07-08: 2 g via INTRAVENOUS
  Filled 2015-07-08 (×2): qty 50

## 2015-07-08 MED ORDER — ALUM & MAG HYDROXIDE-SIMETH 200-200-20 MG/5ML PO SUSP: 30.0000 mL | ORAL | Status: DC | PRN

## 2015-07-08 MED ORDER — LIDOCAINE HCL (CARDIAC) 20 MG/ML IV SOLN
INTRAVENOUS | Status: DC | PRN
  Administered 2015-07-08: 50 mg via INTRAVENOUS

## 2015-07-08 MED ORDER — ATORVASTATIN CALCIUM 20 MG PO TABS
20.0000 mg | ORAL_TABLET | Freq: Every evening | ORAL | Status: DC
  Administered 2015-07-08: 20 mg via ORAL
  Filled 2015-07-08: qty 1

## 2015-07-08 MED ORDER — 0.9 % SODIUM CHLORIDE (POUR BTL) OPTIME
TOPICAL | Status: DC | PRN
  Administered 2015-07-08: 1000 mL

## 2015-07-08 MED ORDER — LACTATED RINGERS IV SOLN
INTRAVENOUS | Status: DC
  Administered 2015-07-09: 50 mL/h via INTRAVENOUS

## 2015-07-08 MED ORDER — MENTHOL 3 MG MT LOZG: 1.0000 | LOZENGE | OROMUCOSAL | Status: DC | PRN

## 2015-07-08 MED ORDER — SODIUM CHLORIDE 0.9 % IJ SOLN
INTRAMUSCULAR | Status: AC
  Filled 2015-07-08: qty 10

## 2015-07-08 MED ORDER — LACTATED RINGERS IV SOLN
INTRAVENOUS | Status: DC | PRN
  Administered 2015-07-08 (×2): via INTRAVENOUS

## 2015-07-08 MED ORDER — TRANEXAMIC ACID 1000 MG/10ML IV SOLN
1000.0000 mg | INTRAVENOUS | Status: AC
  Administered 2015-07-08: 1000 mg via INTRAVENOUS
  Filled 2015-07-08: qty 10

## 2015-07-08 MED ORDER — PHENOL 1.4 % MT LIQD: 1.0000 | OROMUCOSAL | Status: DC | PRN

## 2015-07-08 MED ORDER — METHOCARBAMOL 500 MG PO TABS
500.0000 mg | ORAL_TABLET | Freq: Four times a day (QID) | ORAL | Status: DC | PRN
  Administered 2015-07-09: 500 mg via ORAL
  Filled 2015-07-08: qty 1

## 2015-07-08 MED ORDER — METOCLOPRAMIDE HCL 5 MG PO TABS: 5.0000 mg | ORAL_TABLET | Freq: Three times a day (TID) | ORAL | Status: DC | PRN

## 2015-07-08 MED ORDER — BUPIVACAINE LIPOSOME 1.3 % IJ SUSP
20.0000 mL | INTRAMUSCULAR | Status: DC
  Filled 2015-07-08: qty 20

## 2015-07-08 MED ORDER — ONDANSETRON HCL 4 MG PO TABS: 4.0000 mg | ORAL_TABLET | Freq: Four times a day (QID) | ORAL | Status: DC | PRN

## 2015-07-08 MED ORDER — PANTOPRAZOLE SODIUM 40 MG PO TBEC
40.0000 mg | DELAYED_RELEASE_TABLET | Freq: Every day | ORAL | Status: DC
  Administered 2015-07-09: 40 mg via ORAL
  Filled 2015-07-08 (×2): qty 1

## 2015-07-08 MED ORDER — ONDANSETRON HCL 4 MG/2ML IJ SOLN
INTRAMUSCULAR | Status: DC | PRN
  Administered 2015-07-08: 4 mg via INTRAVENOUS

## 2015-07-08 MED ORDER — ACETAMINOPHEN 325 MG PO TABS: 650.0000 mg | ORAL_TABLET | Freq: Four times a day (QID) | ORAL | Status: DC | PRN

## 2015-07-08 MED ORDER — DOCUSATE SODIUM 100 MG PO CAPS
100.0000 mg | ORAL_CAPSULE | Freq: Two times a day (BID) | ORAL | Status: DC
  Administered 2015-07-08 – 2015-07-09 (×2): 100 mg via ORAL
  Filled 2015-07-08 (×2): qty 1

## 2015-07-08 MED ORDER — BUPIVACAINE-EPINEPHRINE (PF) 0.25% -1:200000 IJ SOLN
INTRAMUSCULAR | Status: AC
  Filled 2015-07-08: qty 30

## 2015-07-08 MED ORDER — PROPOFOL 10 MG/ML IV BOLUS
INTRAVENOUS | Status: DC | PRN
  Administered 2015-07-08: 20 mg via INTRAVENOUS
  Administered 2015-07-08: 30 mg via INTRAVENOUS
  Administered 2015-07-08: 20 mg via INTRAVENOUS
  Administered 2015-07-08: 50 mg via INTRAVENOUS

## 2015-07-08 MED ORDER — HYDROMORPHONE HCL 1 MG/ML IJ SOLN: 0.2500 mg | INTRAMUSCULAR | Status: DC | PRN

## 2015-07-08 MED ORDER — MIDAZOLAM HCL 5 MG/5ML IJ SOLN
INTRAMUSCULAR | Status: DC | PRN
  Administered 2015-07-08: 2 mg via INTRAVENOUS

## 2015-07-08 MED ORDER — ACETAMINOPHEN 650 MG RE SUPP: 650.0000 mg | Freq: Four times a day (QID) | RECTAL | Status: DC | PRN

## 2015-07-08 MED ORDER — ASPIRIN EC 325 MG PO TBEC
325.0000 mg | DELAYED_RELEASE_TABLET | Freq: Two times a day (BID) | ORAL | Status: DC
  Administered 2015-07-09: 325 mg via ORAL
  Filled 2015-07-08: qty 1

## 2015-07-08 MED ORDER — SUCCINYLCHOLINE CHLORIDE 20 MG/ML IJ SOLN
INTRAMUSCULAR | Status: AC
  Filled 2015-07-08: qty 1

## 2015-07-08 MED ORDER — METOCLOPRAMIDE HCL 5 MG/ML IJ SOLN: 5.0000 mg | Freq: Three times a day (TID) | INTRAMUSCULAR | Status: DC | PRN

## 2015-07-08 MED ORDER — PROPOFOL 500 MG/50ML IV EMUL
INTRAVENOUS | Status: DC | PRN
  Administered 2015-07-08: 35 ug/kg/min via INTRAVENOUS

## 2015-07-08 MED ORDER — EPHEDRINE SULFATE 50 MG/ML IJ SOLN
INTRAMUSCULAR | Status: AC
  Filled 2015-07-08: qty 1

## 2015-07-08 MED ORDER — MEPERIDINE HCL 25 MG/ML IJ SOLN: 6.2500 mg | INTRAMUSCULAR | Status: DC | PRN

## 2015-07-08 MED ORDER — BUPIVACAINE HCL (PF) 0.5 % IJ SOLN
INTRAMUSCULAR | Status: DC | PRN
  Administered 2015-07-08: 15 mg via INTRATHECAL

## 2015-07-08 MED ORDER — HYDROCODONE-ACETAMINOPHEN 5-325 MG PO TABS
1.0000 | ORAL_TABLET | ORAL | Status: DC | PRN
  Administered 2015-07-08: 1 via ORAL
  Administered 2015-07-09 (×2): 2 via ORAL
  Administered 2015-07-09: 1 via ORAL
  Filled 2015-07-08 (×3): qty 2
  Filled 2015-07-08: qty 1

## 2015-07-08 MED ORDER — MIDAZOLAM HCL 2 MG/2ML IJ SOLN
INTRAMUSCULAR | Status: AC
  Filled 2015-07-08: qty 2

## 2015-07-08 MED ORDER — PROMETHAZINE HCL 25 MG/ML IJ SOLN
6.2500 mg | INTRAMUSCULAR | Status: DC | PRN
Start: 2015-07-08 — End: 2015-07-08

## 2015-07-08 SURGICAL SUPPLY — 63 items
BAG DECANTER FOR FLEXI CONT (MISCELLANEOUS) IMPLANT
BANDAGE ELASTIC 4 VELCRO ST LF (GAUZE/BANDAGES/DRESSINGS) ×3 IMPLANT
BANDAGE ESMARK 6X9 LF (GAUZE/BANDAGES/DRESSINGS) ×1 IMPLANT
BENZOIN TINCTURE PRP APPL 2/3 (GAUZE/BANDAGES/DRESSINGS) ×3 IMPLANT
BLADE SAGITTAL 25.0X1.19X90 (BLADE) ×4 IMPLANT
BLADE SAGITTAL 25.0X1.19X90MM (BLADE) ×2
BLADE SAW SGTL 13.0X1.19X90.0M (BLADE) ×3 IMPLANT
BLADE SURG ROTATE 9660 (MISCELLANEOUS) ×3 IMPLANT
BNDG ELASTIC 6X10 VLCR STRL LF (GAUZE/BANDAGES/DRESSINGS) ×3 IMPLANT
BNDG ESMARK 6X9 LF (GAUZE/BANDAGES/DRESSINGS) ×3
BNDG GAUZE ELAST 4 BULKY (GAUZE/BANDAGES/DRESSINGS) ×3 IMPLANT
BOWL SMART MIX CTS (DISPOSABLE) ×3 IMPLANT
CAP KNEE TOTAL 3 SIGMA ×3 IMPLANT
CEMENT HV SMART SET (Cement) ×6 IMPLANT
CLOSURE WOUND 1/2 X4 (GAUZE/BANDAGES/DRESSINGS) ×1
COVER SURGICAL LIGHT HANDLE (MISCELLANEOUS) ×3 IMPLANT
CUFF TOURNIQUET SINGLE 34IN LL (TOURNIQUET CUFF) ×3 IMPLANT
DECANTER SPIKE VIAL GLASS SM (MISCELLANEOUS) IMPLANT
DRAPE EXTREMITY T 121X128X90 (DRAPE) ×3 IMPLANT
DRAPE PROXIMA HALF (DRAPES) ×3 IMPLANT
DRAPE U-SHAPE 47X51 STRL (DRAPES) ×3 IMPLANT
DRSG ADAPTIC 3X8 NADH LF (GAUZE/BANDAGES/DRESSINGS) ×3 IMPLANT
DRSG PAD ABDOMINAL 8X10 ST (GAUZE/BANDAGES/DRESSINGS) ×3 IMPLANT
DURAPREP 26ML APPLICATOR (WOUND CARE) ×3 IMPLANT
ELECT REM PT RETURN 9FT ADLT (ELECTROSURGICAL) ×3
ELECTRODE REM PT RTRN 9FT ADLT (ELECTROSURGICAL) ×1 IMPLANT
GAUZE SPONGE 4X4 12PLY STRL (GAUZE/BANDAGES/DRESSINGS) ×3 IMPLANT
GLOVE BIO SURGEON STRL SZ7 (GLOVE) ×6 IMPLANT
GLOVE BIO SURGEON STRL SZ8 (GLOVE) ×6 IMPLANT
GLOVE BIOGEL M STER SZ 6 (GLOVE) ×9 IMPLANT
GLOVE BIOGEL PI IND STRL 8 (GLOVE) ×2 IMPLANT
GLOVE BIOGEL PI INDICATOR 8 (GLOVE) ×4
GOWN STRL REUS W/ TWL LRG LVL3 (GOWN DISPOSABLE) ×2 IMPLANT
GOWN STRL REUS W/ TWL XL LVL3 (GOWN DISPOSABLE) ×2 IMPLANT
GOWN STRL REUS W/TWL LRG LVL3 (GOWN DISPOSABLE) ×4
GOWN STRL REUS W/TWL XL LVL3 (GOWN DISPOSABLE) ×4
HANDPIECE INTERPULSE COAX TIP (DISPOSABLE) ×2
HOOD PEEL AWAY FACE SHEILD DIS (HOOD) ×6 IMPLANT
IMMOBILIZER KNEE 22 UNIV (SOFTGOODS) ×3 IMPLANT
KIT BASIN OR (CUSTOM PROCEDURE TRAY) ×3 IMPLANT
KIT ROOM TURNOVER OR (KITS) ×3 IMPLANT
MANIFOLD NEPTUNE II (INSTRUMENTS) ×3 IMPLANT
NEEDLE HYPO 21X1 ECLIPSE (NEEDLE) ×3 IMPLANT
NS IRRIG 1000ML POUR BTL (IV SOLUTION) ×3 IMPLANT
PACK TOTAL JOINT (CUSTOM PROCEDURE TRAY) ×3 IMPLANT
PACK UNIVERSAL I (CUSTOM PROCEDURE TRAY) ×3 IMPLANT
PAD ARMBOARD 7.5X6 YLW CONV (MISCELLANEOUS) ×6 IMPLANT
SET HNDPC FAN SPRY TIP SCT (DISPOSABLE) ×1 IMPLANT
STAPLER VISISTAT 35W (STAPLE) IMPLANT
STRIP CLOSURE SKIN 1/2X4 (GAUZE/BANDAGES/DRESSINGS) ×2 IMPLANT
SUCTION FRAZIER TIP 10 FR DISP (SUCTIONS) IMPLANT
SUT MNCRL AB 3-0 PS2 18 (SUTURE) IMPLANT
SUT VIC AB 0 CT1 27 (SUTURE) ×4
SUT VIC AB 0 CT1 27XBRD ANBCTR (SUTURE) ×2 IMPLANT
SUT VIC AB 2-0 CT1 27 (SUTURE) ×4
SUT VIC AB 2-0 CT1 TAPERPNT 27 (SUTURE) ×2 IMPLANT
SUT VLOC 180 0 24IN GS25 (SUTURE) ×3 IMPLANT
SYR 50ML LL SCALE MARK (SYRINGE) ×3 IMPLANT
TOWEL OR 17X24 6PK STRL BLUE (TOWEL DISPOSABLE) ×3 IMPLANT
TOWEL OR 17X26 10 PK STRL BLUE (TOWEL DISPOSABLE) ×3 IMPLANT
TRAY FOLEY CATH 14FR (SET/KITS/TRAYS/PACK) IMPLANT
UPCHARGE REV TRAY MBT KNEE ×3 IMPLANT
WATER STERILE IRR 1000ML POUR (IV SOLUTION) ×6 IMPLANT

## 2015-07-08 NOTE — Anesthesia Postprocedure Evaluation (Signed)
Anesthesia Post Note  Patient: [name]  Procedure(s) Performed: Procedure(s) (LRB): TOTAL KNEE ARTHROPLASTY WITH HARDWARE REMOVAL (Left)  Patient location during evaluation: PACU Anesthesia Type: Combined General/Spinal Level of consciousness: awake and alert, oriented and patient cooperative Pain management: pain level controlled Vital Signs Assessment: post-procedure vital signs reviewed and stable Respiratory status: spontaneous breathing and nonlabored ventilation Cardiovascular status: blood pressure returned to baseline and stable Postop Assessment: No headache, No backache, Spinal receding and No signs of nausea or vomiting Anesthetic complications: no    Last Vitals:  Filed Vitals:   07/08/15 1230 07/08/15 1345  BP: 110/76 120/81  Pulse: 49 55  Temp:    Resp: 11 14    Last Pain: There were no vitals filed for this visit.          L Sensory Level: L4-Anterior knee, lower leg R Sensory Level: L4-Anterior knee, lower leg  [name],[name]

## 2015-07-08 NOTE — Discharge Instructions (Signed)

## 2015-07-08 NOTE — Op Note (Signed)
PREOP DIAGNOSIS: DJD LEFT KNEE POSTOP DIAGNOSIS:  same PROCEDURE: LEFT TKR and hardware removal ANESTHESIA: General and failed spinal ATTENDING SURGEON: Katina Remick G ASSISTANT: Loni Dolly PA  INDICATIONS FOR PROCEDURE: Matthew Becker is a 64 y.o. male who has struggled for a long time with pain due to degenerative arthritis of the left knee.  The patient has failed many conservative non-operative measures and at this point has pain which limits the ability to sleep and walk. He has a moderate varus deformity.  He has a history of open surgery on this knee in 1980. The patient is offered total knee replacement.  Informed operative consent was obtained after discussion of possible risks of anesthesia, infection, neurovascular injury, DVT, and death.  The importance of the post-operative rehabilitation protocol to optimize result was stressed extensively with the patient.  SUMMARY OF FINDINGS AND PROCEDURE:  Matthew Becker was taken to the operative suite where under the above anesthesia a left knee replacement was performed.  There were advanced degenerative changes and the bone quality was excellent.  We used the DePuyLCS system and placed size large femur, 6 MBT revision tibia, 41 mm all polyethylene patella, and a size 15 mm spacer.  We were able to correct the varus deformity.  We removed one screw from the tibia but left his staple in place. Loni Dolly PA-C assisted throughout and was invaluable to the completion of the case in that he helped retract and maintain exposure while I placed the components.  He also helped close thereby minimizing OR time.  The patient was admitted for appropriate post-op care to include perioperative antibiotics and mechanical and pharmacologic measures for DVT prophylaxis.  DESCRIPTION OF PROCEDURE:  Matthew Becker was taken to the operative suite where the above anesthesia was applied.  The patient was positioned supine and prepped and draped in normal sterile  fashion.  An appropriate time out was performed.  After the administration of kefzol pre-op antibiotic the leg was elevated and exsanguinated and a tourniquet inflated.  A standard longitudinal incision was made on the anterior knee.  Dissection was carried down to the extensor mechanism.  All appropriate anti-infective measures were used including the pre-operative antibiotic, betadine impregnated drape, and closed hooded exhaust systems for each member of the surgical team.  A medial parapatellar incision was made in the extensor mechanism and the knee cap flipped and the knee flexed.  Some residual meniscal tissues were removed along with any remaining ACL/PCL tissue.  A guide was placed on the tibia and a flat cut was made on it's superior surface.  An intramedullary guide was placed in the femur and was utilized to make anterior and posterior cuts creating an appropriate flexion gap.  A second intramedullary guide was placed in the femur to make a distal cut properly balancing the knee with an extension gap equal to the flexion gap.  The three bones sized to the above mentioned sizes and the appropriate guides were placed and utilized.  A trial reduction was done and the knee easily came to full extension and the patella tracked well on flexion.  The trial components were removed and all bones were cleaned with pulsatile lavage and then dried thoroughly.  Cement was mixed and was pressurized onto the bones followed by placement of the aforementioned components.  Excess cement was trimmed and pressure was held on the components until the cement had hardened.  The tourniquet was deflated and a small amount of bleeding was controlled with cautery and pressure.  The knee was irrigated thoroughly.  The extensor mechanism was re-approximated with V-loc suture in running fashion.  The knee was flexed and the repair was solid.  The subcutaneous tissues were re-approximated with #0 and #2-0 vicryl and the skin closed  with a subcuticular stitch and steristrips.  A sterile dressing was applied.  Intraoperative fluids, EBL, and tourniquet time can be obtained from anesthesia records.  DISPOSITION:  The patient was taken to recovery room in stable condition and admitted for appropriate post-op care to include peri-operative antibiotic and DVT prophylaxis with mechanical and pharmacologic measures.  Matthew Becker G 07/08/2015, 9:41 AM

## 2015-07-08 NOTE — Progress Notes (Signed)
Orthopedic Tech Progress Note Patient Details:  Matthew Becker [birth year] [id number]  CPM Left Knee CPM Left Knee: On Left Knee Flexion (Degrees): 9 Left Knee Extension (Degrees): 0 Ortho will provide ohf when one becomes available  Hildred Priest 07/08/2015, 10:57 AM

## 2015-07-08 NOTE — Evaluation (Signed)
Physical Therapy Evaluation Patient Details Name: Matthew Becker MRN: [id number] DOB: 1950/08/30 Today's Date: 07/08/2015   History of Present Illness  64 y.o. male admitted to [facility] on 07/08/15 for elective L TKA.  Pt with significant PMHx of R knee menisectomy and left knee ACL repair.   Clinical Impression  Pt is POD #0 and is limited to in room ambulation due to N/V.  RN aware.  I anticipate that he will progress well enough to d/c home with HHPT and wife's assist at discharge.   PT to follow acutely for deficits listed below.       Follow Up Recommendations Home health PT;Supervision for mobility/OOB    Equipment Recommendations  None recommended by PT    Recommendations for Other Services   NA    Precautions / Restrictions Precautions Precautions: Knee Precaution Booklet Issued: Yes (comment) Precaution Comments: knee handout given Required Braces or Orthoses: Knee Immobilizer - Left Knee Immobilizer - Left: Other (comment) (until d/c by MD) Restrictions Weight Bearing Restrictions: Yes LLE Weight Bearing: Weight bearing as tolerated      Mobility  Bed Mobility Overal bed mobility: Needs Assistance Bed Mobility: Supine to Sit     Supine to sit: Min assist     General bed mobility comments: min assist to support leg during transition to sit.  Verbal cues for 1/2 bridge technique  Transfers Overall transfer level: Needs assistance Equipment used: Rolling walker (2 wheeled) Transfers: Sit to/from Stand Sit to Stand: Min guard         General transfer comment: min guard assist for safety, verbal cues for safe hand placement.   Ambulation/Gait Ambulation/Gait assistance: Min guard Ambulation Distance (Feet): 15 Feet Assistive device: Rolling walker (2 wheeled) Gait Pattern/deviations: Step-to pattern;Antalgic     General Gait Details: Verbal cues for safe LE sequencing.           Balance Overall balance assessment: Needs assistance Sitting-balance  support: Feet supported;No upper extremity supported Sitting balance-Leahy Scale: Good     Standing balance support: Bilateral upper extremity supported Standing balance-Leahy Scale: Fair Standing balance comment: Rw for dynamic activities needed, standing at sink no hand support to wash hands                             Pertinent Vitals/Pain Pain Assessment: 0-10 Pain Score: 3  Pain Location: left knee at rest, 6 with max CPM motion.  Pain Descriptors / Indicators: Aching;Burning Pain Intervention(s): Limited activity within patient's tolerance;Monitored during session;Repositioned    Home Living Family/patient expects to be discharged to:: Private residence Living Arrangements: Spouse/significant other Available Help at Discharge: Family;Available 24 hours/day Type of Home: House Home Access: Stairs to enter Entrance Stairs-Rails: None Entrance Stairs-Number of Steps: 2 Home Layout: Two level;Able to live on main level with bedroom/bathroom Home Equipment: [place] [employer] - 2 wheels;Bedside commode;Other (comment) (CPM)      Prior Function Level of Independence: Independent               Hand Dominance   Dominant Hand: Right    Extremity/Trunk Assessment   Upper Extremity Assessment: Defer to OT evaluation           Lower Extremity Assessment: LLE deficits/detail   LLE Deficits / Details: left leg with normal post op pain and weakness.  Ankle 3/5, knee 2/5, hip 2+/5  Cervical / Trunk Assessment: Normal  Communication   Communication: No difficulties  Cognition Arousal/Alertness: Awake/alert Behavior During Therapy:  WFL for tasks assessed/performed Overall Cognitive Status: Within Functional Limits for tasks assessed                         Exercises Total Joint Exercises Ankle Circles/Pumps: AROM;Both;20 reps;Seated Quad Sets: AROM;Left;10 reps;Seated Towel Squeeze: AROM;Both;10 reps;Seated Heel Slides: AAROM;Left;10 reps;Seated       Assessment/Plan    PT Assessment Patient needs continued PT services  PT Diagnosis Difficulty walking;Abnormality of gait;Generalized weakness;Acute pain   PT Problem List Decreased strength;Decreased range of motion;Decreased activity tolerance;Decreased balance;Decreased mobility;Decreased knowledge of use of DME;Decreased knowledge of precautions;Pain;Impaired sensation  PT Treatment Interventions DME instruction;Gait training;Stair training;Functional mobility training;Therapeutic exercise;Therapeutic activities;Balance training;Neuromuscular re-education;Patient/family education;Manual techniques;Modalities   PT Goals (Current goals can be found in the Care Plan section) Acute Rehab PT Goals Patient Stated Goal: getting back to skiing and tennis PT Goal Formulation: With patient Time For Goal Achievement: 07/15/15 Potential to Achieve Goals: Good    Frequency 7X/week    End of Session Equipment Utilized During Treatment: Gait belt;Left knee immobilizer Activity Tolerance: Other (comment) (limited by nausea) Patient left: in chair;with call bell/phone within reach Nurse Communication: Other (comment) (pt vomited)         Time: [id number] PT Time Calculation (min) (ACUTE ONLY): 33 min   Charges:   PT Evaluation $Initial PT Evaluation Tier I: 1 Procedure PT Treatments $Therapeutic Exercise: 8-22 mins        Matthew Becker B. Matthew Becker, PT, DPT [phone]   07/08/2015, 5:19 PM

## 2015-07-08 NOTE — Transfer of Care (Signed)
Immediate Anesthesia Transfer of Care Note  Patient: Matthew Becker  Procedure(s) Performed: Procedure(s): TOTAL KNEE ARTHROPLASTY WITH HARDWARE REMOVAL (Left)  Patient Location: PACU  Anesthesia Type:General and Spinal  Level of Consciousness: awake, alert  and oriented  Airway & Oxygen Therapy: Patient Spontanous Breathing and Patient connected to nasal cannula oxygen  Post-op Assessment: Report given to RN and Post -op Vital signs reviewed and stable  Post vital signs: Reviewed and stable  Last Vitals:  Filed Vitals:   07/08/15 1027 07/08/15 1030  BP:  83/54  Pulse: 80 57  Temp: 36.9 C   Resp: 12 11    Complications: No apparent anesthesia complications

## 2015-07-08 NOTE — Interval H&P Note (Signed)
OK for surgery PD 

## 2015-07-08 NOTE — Anesthesia Procedure Notes (Addendum)
Spinal Patient location during procedure: OR Start time: 07/08/2015 7:37 AM End time: 07/08/2015 7:40 AM Staffing Anesthesiologist: Annye Asa Performed by: anesthesiologist  Preanesthetic Checklist Completed: patient identified, site marked, surgical consent, pre-op evaluation, timeout performed, IV checked, risks and benefits discussed and monitors and equipment checked Spinal Block Patient position: sitting Prep: Betadine and ChloraPrep Patient monitoring: heart rate, cardiac monitor, continuous pulse ox and blood pressure Approach: midline Location: L3-4 Injection technique: single-shot Needle Needle type: Quincke  Needle gauge: 25 G Needle length: 9 cm Additional Notes Pt identified in operating room.  Monitors applied. Working IV access confirmed. Sterile prep, drape L 3,4. 1% lido local L 3,4. #25ga Quincke into clear CSF.  15mg  Bupivacaine into clear CSF, asp beginning and end of injection.  Patient asymptomatic, VSS, no heme aspirated, tolerated well.  Jenita Seashore, MD  Procedure Name: LMA Insertion Date/Time: 07/08/2015 8:09 AM Performed by: Mariea Clonts Pre-anesthesia Checklist: Timeout performed, Patient identified, Emergency Drugs available, Suction available and Patient being monitored Patient Re-evaluated:Patient Re-evaluated prior to inductionOxygen Delivery Method: Simple face mask Preoxygenation: Pre-oxygenation with 100% oxygen Intubation Type: IV induction Ventilation: Mask ventilation without difficulty LMA: LMA inserted LMA Size: 5.0 Placement Confirmation: positive ETCO2 and breath sounds checked- equal and bilateral Tube secured with: Tape Dental Injury: Teeth and Oropharynx as per pre-operative assessment

## 2015-07-08 NOTE — Progress Notes (Signed)
Utilization review completed.  

## 2015-07-09 MED ORDER — METHOCARBAMOL 500 MG PO TABS: 500.0000 mg | ORAL_TABLET | Freq: Four times a day (QID) | ORAL | Status: DC | PRN

## 2015-07-09 MED ORDER — ASPIRIN 325 MG PO TBEC: 325.0000 mg | DELAYED_RELEASE_TABLET | Freq: Two times a day (BID) | ORAL | Status: DC

## 2015-07-09 MED ORDER — HYDROCODONE-ACETAMINOPHEN 5-325 MG PO TABS: 1.0000 | ORAL_TABLET | ORAL | Status: DC | PRN

## 2015-07-09 NOTE — Progress Notes (Signed)
Subjective: 1 Day Post-Op Procedure(s) (LRB): TOTAL KNEE ARTHROPLASTY WITH HARDWARE REMOVAL (Left)  Activity level:  wbat Diet tolerance:  ok Voiding:  ok Patient reports pain as mild.    Objective: Vital signs in last 24 hours: Temp:  [97.7 F (36.5 C)-98.7 F (37.1 C)] 98.2 F (36.8 C) (11/23 0400) Pulse Rate:  [48-86] 77 (11/23 0400) Resp:  [9-18] 16 (11/23 0400) BP: (109-159)/(70-88) 145/82 mmHg (11/23 1037) SpO2:  [94 %-100 %] 94 % (11/23 0400)  Labs: No results for input(s): HGB in the last 72 hours. No results for input(s): WBC, RBC, HCT, PLT in the last 72 hours. No results for input(s): NA, K, CL, CO2, BUN, CREATININE, GLUCOSE, CALCIUM in the last 72 hours. No results for input(s): LABPT, INR in the last 72 hours.  Physical Exam:  Neurologically intact ABD soft Neurovascular intact Sensation intact distally Intact pulses distally Dorsiflexion/Plantar flexion intact Incision: dressing C/D/I and no drainage No cellulitis present Compartment soft  Assessment/Plan:  1 Day Post-Op Procedure(s) (LRB): TOTAL KNEE ARTHROPLASTY WITH HARDWARE REMOVAL (Left) Advance diet Up with therapy D/C IV fluids Discharge home with home health Today after PT. Dressing changed to aquacel.  Continue on ASA 325mg  BID x 2 weeks for dvt prevention. Follow up in office 2 weeks post op.    Cirilo Canner, Larwance Sachs 07/09/2015, 11:58 AM

## 2015-07-09 NOTE — Evaluation (Signed)
Occupational Therapy Evaluation Patient Details Name: Matthew Becker MRN: [id number] DOB: 05-01-1951 Today's Date: 07/09/2015    History of Present Illness 64 y.o. male admitted to [facility] on 07/08/15 for elective L TKA.  Pt with significant PMHx of R knee menisectomy and left knee ACL repair.    Clinical Impression   Pt reports he was independent with ADLs and mobility PTA. Currently pt is overall supervision for functional mobility and ADLs with the exception of min assist for LB ADLs. All education completed; pt and wife with no further questions or concerns for OT at this time. Pt planning to d/c home with 24/7 supervision from his wife. Pt ready to d/c from an OT standpoint; signing off at this time. Thank you for this referral.     Follow Up Recommendations  No OT follow up;Supervision - Intermittent    Equipment Recommendations  None recommended by OT (Pt reports 3 in 1 already delivered to house )    Recommendations for Other Services       Precautions / Restrictions Precautions Precautions: Knee Required Braces or Orthoses: Knee Immobilizer - Left Knee Immobilizer - Left: Other (comment) (until discontinued) Restrictions Weight Bearing Restrictions: Yes LLE Weight Bearing: Weight bearing as tolerated      Mobility Bed Mobility Overal bed mobility: Needs Assistance Bed Mobility: Supine to Sit;Sit to Supine     Supine to sit: Min assist Sit to supine: Min assist   General bed mobility comments: Min A to support LLE to EOB. HOB flat, use of rails  Transfers Overall transfer level: Needs assistance Equipment used: Rolling walker (2 wheeled) Transfers: Sit to/from Stand Sit to Stand: Supervision         General transfer comment: Supervision for safety; no physical assist required. Good hand placement and technique. Sit <> stand from EOB x 1, BSC x 1    Balance Overall balance assessment: Needs assistance Sitting-balance support: Feet supported Sitting  balance-Leahy Scale: Good     Standing balance support: Bilateral upper extremity supported Standing balance-Leahy Scale: Fair Standing balance comment: RW for support                             ADL Overall ADL's : Needs assistance/impaired Eating/Feeding: Set up;Sitting   Grooming: Supervision/safety;Standing       Lower Body Bathing: Minimal assistance;Sit to/from stand       Lower Body Dressing: Minimal assistance;Sit to/from stand Lower Body Dressing Details (indicate cue type and reason): Pt unable to reach B feet. Discussed use of AE; pt declined stating wife could assist as needed. Educated on compensatory strategies for LB ADLs; pt and wife verbalized understanding. Toilet Transfer: Supervision/safety;Ambulation;BSC;RW (BSC over toilet) Toilet Transfer Details (indicate cue type and reason): Educated on use of 3 in 1 over toilet. Toileting- Clothing Manipulation and Hygiene: Supervision/safety;Sit to/from stand   Tub/ Shower Transfer: Supervision/safety;Walk-in shower;Ambulation;Shower Technical sales engineer Details (indicate cue type and reason): Educated and demonstrated walk in shower transfer technique; pt able to return demonstrate technique. Educated on need for close supervision for safety during ADLs and mobility; pt and wife verbalize understanding Functional mobility during ADLs: Supervision/safety;Rolling walker General ADL Comments: Wife present for OT session. Educated on edema management techniques and home safety; pt and wife verbalize understanding.     Vision     Perception     Praxis      Pertinent Vitals/Pain Pain Assessment: 0-10 Pain Score: 4  Pain Location: L  knee Pain Descriptors / Indicators: Aching;Sore Pain Intervention(s): Limited activity within patient's tolerance;Monitored during session;Repositioned;Ice applied     Hand Dominance Right   Extremity/Trunk Assessment Upper Extremity Assessment Upper  Extremity Assessment: Overall WFL for tasks assessed   Lower Extremity Assessment Lower Extremity Assessment: Defer to PT evaluation   Cervical / Trunk Assessment Cervical / Trunk Assessment: Normal   Communication Communication Communication: No difficulties   Cognition Arousal/Alertness: Awake/alert Behavior During Therapy: WFL for tasks assessed/performed Overall Cognitive Status: Within Functional Limits for tasks assessed                     General Comments       Exercises Exercises: Total Joint     Shoulder Instructions      Home Living Family/patient expects to be discharged to:: Private residence Living Arrangements: Spouse/significant other Available Help at Discharge: Family;Available 24 hours/day Type of Home: House Home Access: Stairs to enter [employer] of Steps: 2 Entrance Stairs-Rails: None Home Layout: Two level;Able to live on main level with bedroom/bathroom     Bathroom Shower/Tub: Walk-in shower;Door   [employer] Toilet: Handicapped height Bathroom Accessibility: Yes How Accessible: Accessible via walker Home Equipment: [place] - 2 wheels;Bedside commode;Other (comment);Shower seat (CPM)          Prior Functioning/Environment Level of Independence: Independent             OT Diagnosis: Acute pain   OT Problem List:     OT Treatment/Interventions:      OT Goals(Current goals can be found in the care plan section) Acute Rehab OT Goals Patient Stated Goal: to go home later today OT Goal Formulation: With patient  OT Frequency:     Barriers to D/C:            Co-evaluation              End of Session Equipment Utilized During Treatment: Gait belt;Rolling walker CPM Left Knee CPM Left Knee: On Left Knee Flexion (Degrees): 50 Left Knee Extension (Degrees): 0  Activity Tolerance: Patient tolerated treatment well Patient left: in bed;with call bell/phone within reach;with family/visitor present;with  SCD's reapplied   Time: [id number] OT Time Calculation (min): 18 min Charges:  OT General Charges $OT Visit: 1 Procedure OT Evaluation $Initial OT Evaluation Tier I: 1 Procedure G-Codes:     Binnie Kand M.S., OTR/L Pager: [phone]  07/09/2015, 1:37 PM

## 2015-07-09 NOTE — Progress Notes (Signed)
OT Cancellation Note  Patient Details Name: Matthew Becker MRN: [id number] DOB: 1951-06-26   Cancelled Treatment:    Reason Eval/Treat Not Completed: Other (comment) (Pt eating lunch ). Will check back for OT eval as time allows.   Binnie Kand M.S., OTR/L Pager: [phone]  07/09/2015, 12:18 PM

## 2015-07-09 NOTE — Progress Notes (Addendum)
Physical Therapy Treatment Patient Details Name: Matthew Becker MRN: [id number] DOB: 1951/06/18 Today's Date: 07/09/2015    History of Present Illness 64 y.o. male admitted to [facility] on 07/08/15 for elective L TKA.  Pt with significant PMHx of R knee menisectomy and left knee ACL repair.     PT Comments    Pt tolerated treatment much better this A.M.;pt had no reports of nausea. Pt was able to perform HEP exercises without any complaints of pain. Stair training will be a priority for afternoon treatment to prepare patient for home. Pt left in CPM at end of treatment, wife instructed on how to increase or decrease the ROM due to patients pain tolerance.  Follow Up Recommendations  Home health PT;Supervision for mobility/OOB     Equipment Recommendations  None recommended by PT       Precautions / Restrictions Precautions Precautions: Knee Required Braces or Orthoses: Knee Immobilizer - Left Knee Immobilizer - Left: Other (comment) (until discontinued) Restrictions Weight Bearing Restrictions: Yes LLE Weight Bearing: Weight bearing as tolerated    Mobility  Bed Mobility Overal bed mobility: Needs Assistance Bed Mobility: Supine to Sit;Sit to Supine     Supine to sit: Min assist Sit to supine: Min assist   General bed mobility comments: Pt requires min assistance to support leg during transition from sit to supine and supine to sit.  Transfers Overall transfer level: Needs assistance Equipment used: Rolling walker (2 wheeled) Transfers: Sit to/from Stand Sit to Stand: Supervision         General transfer comment: pt requires supervision during transfers for safety. Pt able to recal hand placement when standing up.  Ambulation/Gait Ambulation/Gait assistance: Supervision Ambulation Distance (Feet): 150 Feet Assistive device: Rolling walker (2 wheeled) Gait Pattern/deviations: Trunk flexed;Step-to pattern Gait velocity: decreased Gait velocity interpretation: <1.8  ft/sec, indicative of risk for recurrent falls General Gait Details: Pt required supervision for gait to maintain pt safety. Pt was able to gait train with observably equal WB on BLE and was able to decrease weight through his UE.                      Cognition Arousal/Alertness: Awake/alert Behavior During Therapy: WFL for tasks assessed/performed Overall Cognitive Status: Within Functional Limits for tasks assessed                      Exercises Total Joint Exercises Ankle Circles/Pumps: AROM;Both;20 reps;Seated Short Arc Quad: AROM;AAROM;Left;10 reps;Supine Hip ABduction/ADduction: AROM;AAROM;Left;10 reps;Supine Straight Leg Raises: AROM;AAROM;Left;10 reps;Supine Goniometry measurement: 21-54 (pt had increase in pain and soreness this morning)       Pertinent Vitals/Pain Pain Assessment: 0-10 Pain Score: 4  Pain Location: left knee Pain Descriptors / Indicators: Aching;Tightness Pain Intervention(s): Monitored during session;Ice applied           PT Goals (current goals can now be found in the care plan section) Progress towards PT goals: Progressing toward goals    Frequency  7X/week    PT Plan Current plan remains appropriate       End of Session Equipment Utilized During Treatment: Gait belt;Left knee immobilizer Activity Tolerance: Patient limited by pain Patient left: in bed;with call bell/phone within reach;with family/visitor present     Time: [id number] PT Time Calculation (min) (ACUTE ONLY): 40 min  Charges:    1 Gait 1 TE 1 TA                    Jule Ser  Wetzel [place] [id number] OFFICE  07/09/2015, 11:27 AM

## 2015-07-09 NOTE — Care Management Note (Signed)
Case Management Note  Patient Details  Name: Matthew Becker MRN: [id number] Date of Birth: September 01, 1950  Subjective/Objective:    64 yr old gentleman s/p left total knee arthroplasty.                Action/Plan: Case manager spoke with patient concerning home health and DME needs at discharge. Choice was offered. Referral was called to [facility] [phone] . Case manager faxed orders, F2F, OP note to [facility] @ [phone]. Patient states rolling walker, 3in1 and CPM have been delivered to his home. He will have family support at discharge.   Expected Discharge Date:   07/09/15                Expected Discharge Plan:   Home with Home Health  In-House Referral:  NA  Discharge planning Services  CM Consult  Post Acute Care Choice:  Home Health Choice offered to:  Patient  DME Arranged:  N/A DME Agency:  [employer]  HH Arranged:  PT [place] Agency:  [facility]  Status of Service:  Completed, signed off  Medicare Important Message Given:    Date Medicare IM Given:    Medicare IM give by:    Date Additional Medicare IM Given:    Additional Medicare Important Message give by:     If discussed at [place] of Stay Meetings, dates discussed:    Additional Comments:  Ninfa Meeker, RN 07/09/2015, 12:31 PM

## 2015-07-09 NOTE — Discharge Summary (Signed)
Patient ID: Matthew Becker MRN: [id number] DOB/AGE: 1950/12/26 64 y.o.  Admit date: 07/08/2015 Discharge date: 07/09/2015  Admission Diagnoses:  Principal Problem:   Primary osteoarthritis of left knee Active Problems:   Primary osteoarthritis of knee   Discharge Diagnoses:  Same  Past Medical History  Diagnosis Date  . GERD (gastroesophageal reflux disease)   . Hyperlipidemia     Surgeries: Procedure(s): TOTAL KNEE ARTHROPLASTY WITH HARDWARE REMOVAL on 07/08/2015   Consultants:    Discharged Condition: Improved  Hospital Course: Matthew Becker is an 64 y.o. male who was admitted 07/08/2015 for operative treatment ofPrimary osteoarthritis of left knee. Patient has severe unremitting pain that affects sleep, daily activities, and work/hobbies. After pre-op clearance the patient was taken to the operating room on 07/08/2015 and underwent  Procedure(s): TOTAL KNEE ARTHROPLASTY WITH HARDWARE REMOVAL.    Patient was given perioperative antibiotics: Anti-infectives    Start     Dose/Rate Route Frequency Ordered Stop   07/08/15 1700  ceFAZolin (ANCEF) IVPB 2 g/50 mL premix     2 g 100 mL/hr over 30 Minutes Intravenous Every 6 hours 07/08/15 1627 07/09/15 0459   07/08/15 0700  ceFAZolin (ANCEF) IVPB 2 g/50 mL premix     2 g 100 mL/hr over 30 Minutes Intravenous To [facility]Stay [facility] 07/07/15 1410 07/08/15 0737       Patient was given sequential compression devices, early ambulation, and chemoprophylaxis to prevent DVT.  Patient benefited maximally from hospital stay and there were no complications.    Recent vital signs: Patient Vitals for the past 24 hrs:  BP Temp Temp src Pulse Resp SpO2  07/09/15 1037 (!) 145/82 mmHg - - - - -  07/09/15 0400 (!) 152/83 mmHg 98.2 F (36.8 C) Oral 77 16 94 %  07/08/15 2352 (!) 146/84 mmHg 98.7 F (37.1 C) Oral 84 16 99 %  07/08/15 2049 (!) 159/80 mmHg 98.5 F (36.9 C) Oral 60 16 96 %  07/08/15 1613 132/78 mmHg 97.8 F (36.6 C)  Oral 73 16 100 %  07/08/15 1545 124/80 mmHg 97.7 F (36.5 C) - (!) 59 (!) 9 99 %  07/08/15 1530 121/78 mmHg - - (!) 59 12 97 %  07/08/15 1515 120/70 mmHg - - 86 17 96 %  07/08/15 1500 121/80 mmHg - - 78 16 98 %  07/08/15 1445 131/74 mmHg - - 71 15 98 %  07/08/15 1430 127/88 mmHg - - (!) 57 18 99 %  07/08/15 1415 112/83 mmHg - - (!) 55 13 98 %  07/08/15 1400 115/77 mmHg - - (!) 50 (!) 9 99 %  07/08/15 1345 120/81 mmHg - - (!) 55 14 98 %  07/08/15 1330 111/79 mmHg - - 60 15 99 %  07/08/15 1315 109/79 mmHg - - (!) 53 (!) 9 98 %  07/08/15 1300 115/75 mmHg - - (!) 58 16 100 %  07/08/15 1245 116/84 mmHg - - (!) 50 13 96 %  07/08/15 1230 110/76 mmHg - - (!) 49 11 97 %  07/08/15 1215 118/80 mmHg - - (!) 48 12 96 %     Recent laboratory studies: No results for input(s): WBC, HGB, HCT, PLT, NA, K, CL, CO2, BUN, CREATININE, GLUCOSE, INR, CALCIUM in the last 72 hours.  Invalid input(s): PT, 2   Discharge Medications:     Medication List    TAKE these medications        aspirin 325 MG EC tablet  Take 1 tablet (325 mg total) by  mouth 2 (two) times daily after a meal.     atorvastatin 20 MG tablet  Commonly known as:  LIPITOR  Take 20 mg by mouth every evening.     HYDROcodone-acetaminophen 5-325 MG tablet  Commonly known as:  NORCO/VICODIN  Take 1-2 tablets by mouth every 4 (four) hours as needed (breakthrough pain).     methocarbamol 500 MG tablet  Commonly known as:  ROBAXIN  Take 1 tablet (500 mg total) by mouth every 6 (six) hours as needed for muscle spasms.     omeprazole 20 MG capsule  Commonly known as:  PRILOSEC  Take 20 mg by mouth daily.     vitamin B-12 1000 MCG tablet  Commonly known as:  CYANOCOBALAMIN  Take 1,000 mcg by mouth daily.        Diagnostic Studies: Dg Chest 2 View  06/25/2015  CLINICAL DATA:  Preoperative examination prior to left total knee joint replacement, nonsmoker. EXAM: CHEST  2 VIEW COMPARISON:  PA and lateral chest x-ray of November 11, 2010  FINDINGS: The lungs are adequately inflated. There is no focal infiltrate. The interstitial markings are mildly increased bilaterally. There is no pleural effusion. The heart and pulmonary vascularity are normal. The mediastinum is normal in width. There is mild multilevel degenerative disc space narrowing in the upper thoracic spine. IMPRESSION: Persistent mild pulmonary interstitial marking increase slightly more conspicuous than in the past. There is no alveolar pneumonia, CHF, nor other acute cardiopulmonary abnormality. Electronically Signed   By: Armend  Martinique M.D.   On: 06/25/2015 13:56    Disposition: 01-Home or Self Care      Discharge Instructions    Call MD / Call 911    Complete by:  As directed   If you experience chest pain or shortness of breath, CALL 911 and be transported to the hospital emergency room.  If you develope a fever above 101 F, pus (white drainage) or increased drainage or redness at the wound, or calf pain, call your surgeon's office.     Constipation Prevention    Complete by:  As directed   Drink plenty of fluids.  Prune juice may be helpful.  You may use a stool softener, such as Colace (over the counter) 100 mg twice a day.  Use MiraLax (over the counter) for constipation as needed.     Diet - low sodium heart healthy    Complete by:  As directed      Discharge instructions    Complete by:  As directed   INSTRUCTIONS AFTER JOINT REPLACEMENT   Remove items at home which could result in a fall. This includes throw rugs or furniture in walking pathways ICE to the affected joint every three hours while awake for 30 minutes at a time, for at least the first 3-5 days, and then as needed for pain and swelling.  Continue to use ice for pain and swelling. You may notice swelling that will progress down to the foot and ankle.  This is normal after surgery.  Elevate your leg when you are not up walking on it.   Continue to use the breathing machine you got in the hospital  (incentive spirometer) which will help keep your temperature down.  It is common for your temperature to cycle up and down following surgery, especially at night when you are not up moving around and exerting yourself.  The breathing machine keeps your lungs expanded and your temperature down.   DIET:  As you were  doing prior to hospitalization, we recommend a well-balanced diet.  DRESSING / WOUND CARE / SHOWERING  You may shower 3 days after surgery, but keep the wounds dry during showering.  You may use an occlusive plastic wrap (Press'n Seal for example), NO SOAKING/SUBMERGING IN THE BATHTUB.  If the bandage gets wet, change with a clean dry gauze.  If the incision gets wet, pat the wound dry with a clean towel.  ACTIVITY  Increase activity slowly as tolerated, but follow the weight bearing instructions below.   No driving for 6 weeks or until further direction given by your physician.  You cannot drive while taking narcotics.  No lifting or carrying greater than 10 lbs. until further directed by your surgeon. Avoid periods of inactivity such as sitting longer than an hour when not asleep. This helps prevent blood clots.  You may return to work once you are authorized by your doctor.     WEIGHT BEARING   Weight bearing as tolerated with assist device (walker, cane, etc) as directed, use it as long as suggested by your surgeon or therapist, typically at least 4-6 weeks.   EXERCISES  Results after joint replacement surgery are often greatly improved when you follow the exercise, range of motion and muscle strengthening exercises prescribed by your doctor. Safety measures are also important to protect the joint from further injury. Any time any of these exercises cause you to have increased pain or swelling, decrease what you are doing until you are comfortable again and then slowly increase them. If you have problems or questions, call your caregiver or physical therapist for advice.    Rehabilitation is important following a joint replacement. After just a few days of immobilization, the muscles of the leg can become weakened and shrink (atrophy).  These exercises are designed to build up the tone and strength of the thigh and leg muscles and to improve motion. Often times heat used for twenty to thirty minutes before working out will loosen up your tissues and help with improving the range of motion but do not use heat for the first two weeks following surgery (sometimes heat can increase post-operative swelling).   These exercises can be done on a training (exercise) mat, on the floor, on a table or on a bed. Use whatever works the best and is most comfortable for you.    Use music or television while you are exercising so that the exercises are a pleasant break in your day. This will make your life better with the exercises acting as a break in your routine that you can look forward to.   Perform all exercises about fifteen times, three times per day or as directed.  You should exercise both the operative leg and the other leg as well.   Exercises include:   Quad Sets - Tighten up the muscle on the front of the thigh (Quad) and hold for 5-10 seconds.   Straight Leg Raises - With your knee straight (if you were given a brace, keep it on), lift the leg to 60 degrees, hold for 3 seconds, and slowly lower the leg.  Perform this exercise against resistance later as your leg gets stronger.  Leg Slides: Lying on your back, slowly slide your foot toward your buttocks, bending your knee up off the floor (only go as far as is comfortable). Then slowly slide your foot back down until your leg is flat on the floor again.  Angel Wings: Lying on your back spread  your legs to the side as far apart as you can without causing discomfort.  Hamstring Strength:  Lying on your back, push your heel against the floor with your leg straight by tightening up the muscles of your buttocks.  Repeat, but this  time bend your knee to a comfortable angle, and push your heel against the floor.  You may put a pillow under the heel to make it more comfortable if necessary.   A rehabilitation program following joint replacement surgery can speed recovery and prevent re-injury in the future due to weakened muscles. Contact your doctor or a physical therapist for more information on knee rehabilitation.    CONSTIPATION  Constipation is defined medically as fewer than three stools per week and severe constipation as less than one stool per week.  Even if you have a regular bowel pattern at home, your normal regimen is likely to be disrupted due to multiple reasons following surgery.  Combination of anesthesia, postoperative narcotics, change in appetite and fluid intake all can affect your bowels.   YOU MUST use at least one of the following options; they are listed in order of increasing strength to get the job done.  They are all available over the counter, and you may need to use some, POSSIBLY even all of these options:    Drink plenty of fluids (prune juice may be helpful) and high fiber foods Colace 100 mg by mouth twice a day  Senokot for constipation as directed and as needed Dulcolax (bisacodyl), take with full glass of water  Miralax (polyethylene glycol) once or twice a day as needed.  If you have tried all these things and are unable to have a bowel movement in the first 3-4 days after surgery call either your surgeon or your primary doctor.    If you experience loose stools or diarrhea, hold the medications until you stool forms back up.  If your symptoms do not get better within 1 week or if they get worse, check with your doctor.  If you experience "the worst abdominal pain ever" or develop nausea or vomiting, please contact the office immediately for further recommendations for treatment.   ITCHING:  If you experience itching with your medications, try taking only a single pain pill, or even  half a pain pill at a time.  You can also use Benadryl over the counter for itching or also to help with sleep.   TED HOSE STOCKINGS:  Use stockings on both legs until for at least 2 weeks or as directed by physician office. They may be removed at night for sleeping.  MEDICATIONS:  See your medication summary on the "After Visit Summary" that nursing will review with you.  You may have some home medications which will be placed on hold until you complete the course of blood thinner medication.  It is important for you to complete the blood thinner medication as prescribed.  PRECAUTIONS:  If you experience chest pain or shortness of breath - call 911 immediately for transfer to the hospital emergency department.   If you develop a fever greater that 101 F, purulent drainage from wound, increased redness or drainage from wound, foul odor from the wound/dressing, or calf pain - CONTACT YOUR SURGEON.  FOLLOW-UP APPOINTMENTS:  If you do not already have a post-op appointment, please call the office for an appointment to be seen by your surgeon.  Guidelines for how soon to be seen are listed in your "After Visit Summary", but are typically between 1-4 weeks after surgery.  OTHER INSTRUCTIONS:   Knee Replacement:  Do not place pillow under knee, focus on keeping the knee straight while resting. CPM instructions: 0-90 degrees, 2 hours in the morning, 2 hours in the afternoon, and 2 hours in the evening. Place foam block, curve side up under heel at all times except when in CPM or when walking.  DO NOT modify, tear, cut, or change the foam block in any way.  MAKE SURE YOU:  Understand these instructions.  Get help right away if you are not doing well or get worse.    Thank you for letting us be a part of your medical care team.  It is a privilege we respect greatly.  We hope these instructions will help you stay on track for a fast and full recovery!      Increase activity slowly as tolerated    Complete by:  As directed            Follow-up Information    Follow up with Hessie Dibble, MD. Schedule an appointment as soon as possible for a visit in 2 weeks.   Specialty:  Orthopedic Surgery   Contact information:   [place] [address] [phone]        Signed: Rich Fuchs 07/09/2015, 12:01 PM

## 2015-07-09 NOTE — Progress Notes (Signed)
Physical Therapy Treatment Patient Details Name: Matthew Becker MRN: [id number] DOB: [birth year] Today's Date: 07/09/2015    History of Present Illness 64 y.o. male admitted to [facility] on 07/08/15 for elective L TKA.  Pt with significant PMHx of R knee menisectomy and left knee ACL repair.     PT Comments    Pt did well during afternoon treatment. Pt was able to safely ascend and descend stairs with good carry over of instructions on BLE sequencing. Instructions given to wife about HEP frequency and correct form.   Follow Up Recommendations  Home health PT;Supervision for mobility/OOB     Equipment Recommendations  None recommended by PT       Precautions / Restrictions Precautions Precautions: Knee Required Braces or Orthoses: Knee Immobilizer - Left Restrictions Weight Bearing Restrictions: Yes LLE Weight Bearing: Weight bearing as tolerated    Mobility  Bed Mobility Overal bed mobility: Needs Assistance Bed Mobility: Supine to Sit;Sit to Supine     Supine to sit: Min assist Sit to supine: Min assist   General bed mobility comments: Min assistance to bring LLE on and off bed  Transfers Overall transfer level: Needs assistance Equipment used: Rolling walker (2 wheeled) Transfers: Sit to/from Stand Sit to Stand: Supervision         General transfer comment: Pt required supervision for safety; no physical assistance required  Ambulation/Gait Ambulation/Gait assistance: Supervision Ambulation Distance (Feet): 150 Feet Assistive device: Rolling walker (2 wheeled) Gait Pattern/deviations: Step-to pattern;Trunk flexed Gait velocity: decreased Gait velocity interpretation: <1.8 ft/sec, indicative of risk for recurrent falls General Gait Details: Veral cuing for heel-toe gait sequence. Pt's wife was able to safely guard(supervision) pt while gait training.   Stairs Stairs: Yes Stairs assistance: Min guard Stair Management: Two rails Number of Stairs: 2 (x 2  trials) General stair comments: Wife instructed on how to guard when pt ascends and descends stairs. [place] cuing on BLE sequencing.      Balance Overall balance assessment: Needs assistance Sitting-balance support: Feet supported Sitting balance-Leahy Scale: Good     Standing balance support: Bilateral upper extremity supported Standing balance-Leahy Scale: Fair Standing balance comment: RW for support                     Cognition Arousal/Alertness: Awake/alert Behavior During Therapy: WFL for tasks assessed/performed Overall Cognitive Status: Within Functional Limits for tasks assessed                      Exercises Total Joint Exercises Heel Slides: AAROM;Left;10 reps;Seated (towel under foot) Long Arc Quad: AROM;AAROM;Left;10 reps;Seated Knee Flexion: AAROM;AROM;Left;10 reps;Seated        Pertinent Vitals/Pain Pain Assessment: 0-10 Pain Score: 6  Pain Location: L knee Pain Descriptors / Indicators: Aching;Tightness Pain Intervention(s): Monitored during session    Home Living Family/patient expects to be discharged to:: Private residence Living Arrangements: Spouse/significant other Available Help at Discharge: Family;Available 24 hours/day Type of Home: House Home Access: Stairs to enter Entrance Stairs-Rails: None Home Layout: Two level;Able to live on main level with bedroom/bathroom Home Equipment: Gilford Rile - 2 wheels;Bedside commode;Other (comment);Shower seat (CPM)      Prior Function Level of Independence: Independent          PT Goals (current goals can now be found in the care plan section) Acute Rehab PT Goals Patient Stated Goal: to go home later today Progress towards PT goals: Progressing toward goals    Frequency  7X/week    PT Plan  Current plan remains appropriate       End of Session Equipment Utilized During Treatment: Gait belt;Left knee immobilizer Activity Tolerance: Patient limited by pain Patient left: in  bed;with call bell/phone within reach;with family/visitor present     Time: [id number] PT Time Calculation (min) (ACUTE ONLY): 44 min  Charges:   2 Gait [place], [place] OFFICE  07/09/2015, 4:00 PM

## 2015-07-14 ENCOUNTER — Encounter (HOSPITAL_COMMUNITY): Payer: Self-pay | Admitting: Orthopaedic Surgery

## 2016-09-28 IMAGING — CR DG CHEST 2V
2 series · 2 of 2 positions shown · non-contrast
Comparison: PA and lateral chest x-ray November 11, 2010

CLINICAL DATA: Preoperative examination prior to left total knee
joint replacement, nonsmoker.

EXAM:
CHEST  2 VIEW

[w chest lat]
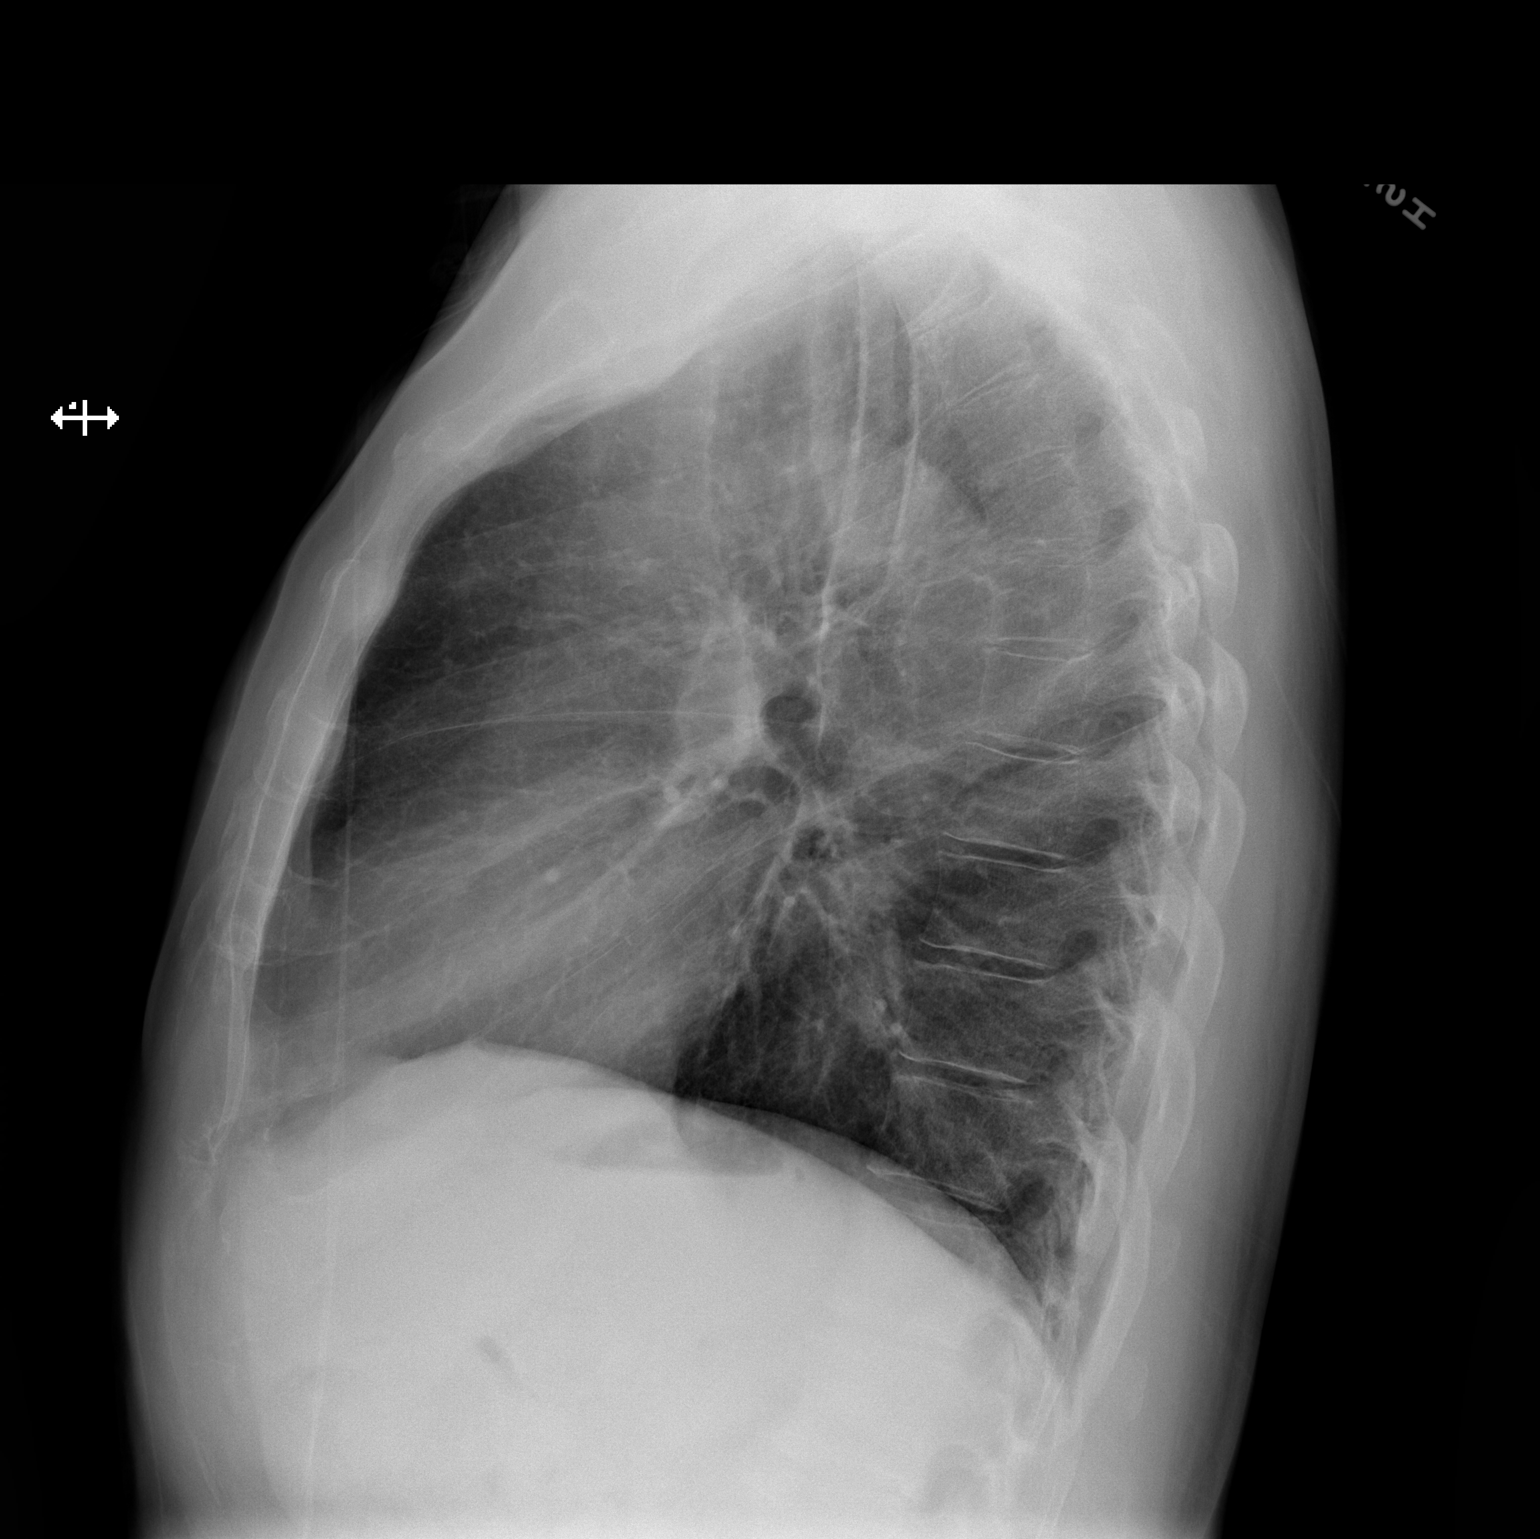

[w chest pa]
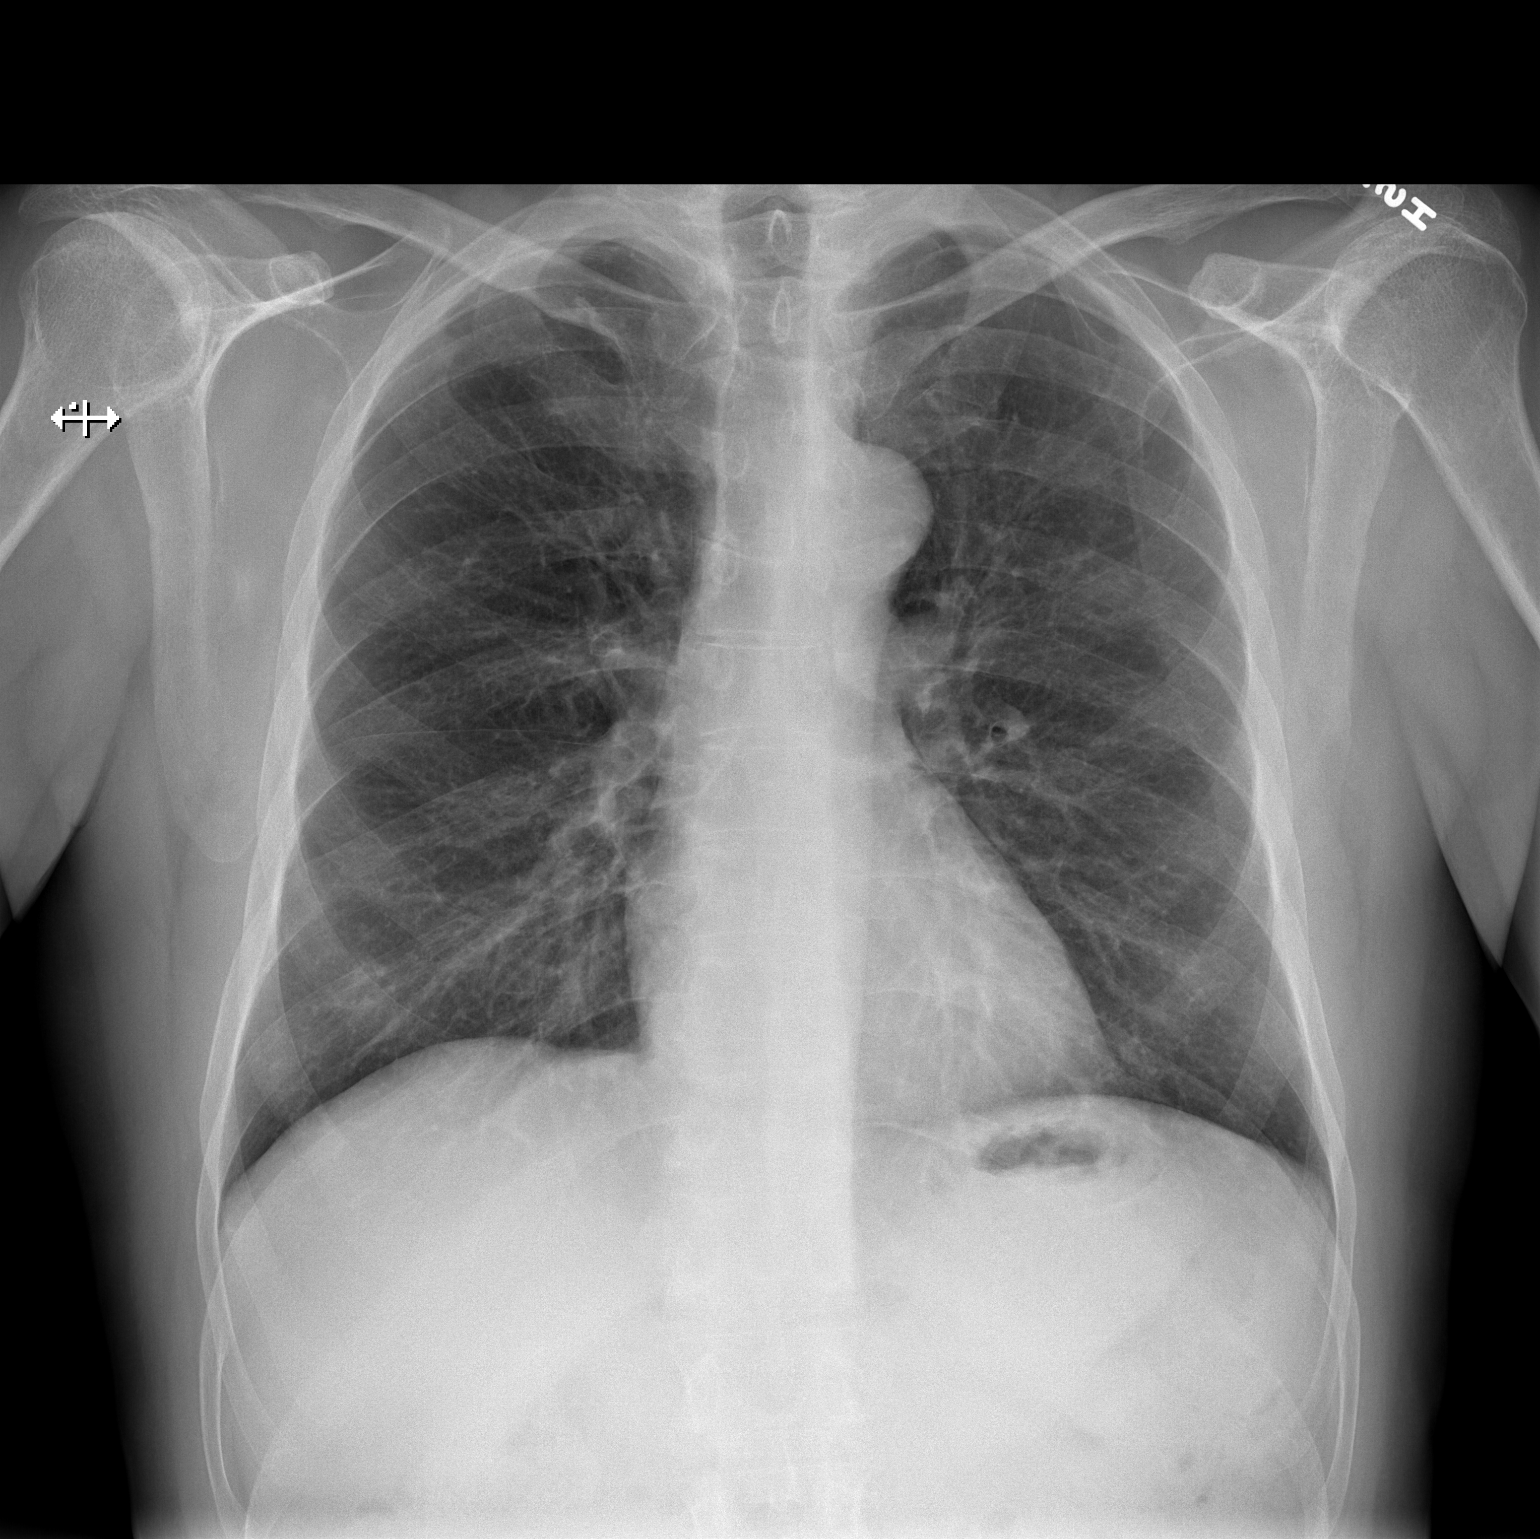

[2 of 2 positions shown; findings below may reference images not displayed]

FINDINGS: The lungs are adequately inflated. There is no focal infiltrate. The
interstitial markings are mildly increased bilaterally. There is no
pleural effusion. The heart and pulmonary vascularity are normal.
The mediastinum is normal in width. There is mild multilevel
degenerative disc space narrowing in the upper thoracic spine.
IMPRESSION: Persistent mild pulmonary interstitial marking increase slightly
more conspicuous than in the past. There is no alveolar pneumonia,
CHF, nor other acute cardiopulmonary abnormality.

## 2017-03-15 ENCOUNTER — Other Ambulatory Visit: Payer: Self-pay | Admitting: Internal Medicine

## 2017-03-15 ENCOUNTER — Ambulatory Visit
Admission: RE | Admit: 2017-03-15 | Discharge: 2017-03-15 | Disposition: A | Discharge status: Home / Self Care | Payer: Medicare Other | Source: Ambulatory Visit | Attending: Internal Medicine | Admitting: Internal Medicine

## 2017-03-15 DIAGNOSIS — M25531 Pain in right wrist: Secondary | ICD-10-CM

## 2017-03-15 DIAGNOSIS — M79641 Pain in right hand: Secondary | ICD-10-CM

## 2017-04-04 DIAGNOSIS — G373 Acute transverse myelitis in demyelinating disease of central nervous system: Secondary | ICD-10-CM | POA: Insufficient documentation

## 2017-04-04 DIAGNOSIS — Z8719 Personal history of other diseases of the digestive system: Secondary | ICD-10-CM | POA: Insufficient documentation

## 2017-04-04 DIAGNOSIS — Z87438 Personal history of other diseases of male genital organs: Secondary | ICD-10-CM | POA: Insufficient documentation

## 2017-04-04 DIAGNOSIS — I8393 Asymptomatic varicose veins of bilateral lower extremities: Secondary | ICD-10-CM | POA: Insufficient documentation

## 2017-04-04 DIAGNOSIS — E785 Hyperlipidemia, unspecified: Secondary | ICD-10-CM | POA: Insufficient documentation

## 2017-04-04 NOTE — Progress Notes (Deleted)
Office Visit Note  Patient: Matthew Becker             Date of Birth: 1950/12/31           MRN: [id number]             PCP: Lavone Orn, MD Referring: Josetta Huddle, MD Visit Date: 04/05/2017 Occupation: potter    Subjective:  Swelling in hands   History of Present Illness: [name] is a 66 y.o. male seen in consultation per request of Dr. Inda Merlin for history of recurrent and swelling and pain. He was treated by Dr. Laurann Montana with prednisone taper and April 2018. Another prednisone taper was given by Dr. Inda Merlin on 01/27/2017 at 50 mg by mouth daily lasting for 10 days. Patient had synovitis over MCPs and wrist joints according to Dr. Inda Merlin.  Activities of Daily Living:  Patient reports morning stiffness for *** {minute/hour:19697}.   Patient {ACTIONS;DENIES/REPORTS:21021675::"Denies"} nocturnal pain.  Difficulty dressing/grooming: {ACTIONS;DENIES/REPORTS:21021675::"Denies"} Difficulty climbing stairs: {ACTIONS;DENIES/REPORTS:21021675::"Denies"} Difficulty getting out of chair: {ACTIONS;DENIES/REPORTS:21021675::"Denies"} Difficulty using hands for taps, buttons, cutlery, and/or writing: {ACTIONS;DENIES/REPORTS:21021675::"Denies"}   No Rheumatology ROS completed.   PMFS History:  Patient Active Problem List   Diagnosis Date Noted  . Primary osteoarthritis of left knee 07/08/2015  . Primary osteoarthritis of knee 07/08/2015  . S/P right inguinal hernia repair 04/18/2013    Past Medical History:  Diagnosis Date  . GERD (gastroesophageal reflux disease)   . Hyperlipidemia     No family history on file. Past Surgical History:  Procedure Laterality Date  . abdominal abcess surgery    . ABDOMINAL SURGERY    . ANTERIOR CRUCIATE LIGAMENT REPAIR     1974  . INGUINAL HERNIA REPAIR Right 03/23/2013   Procedure: HERNIA REPAIR INGUINAL ADULT;  Surgeon: Pedro Earls, MD;  Location: [facility];  Service: General;  Laterality: Right;  With Mesh  . KNEE ARTHROSCOPY     rt  . TOTAL  KNEE ARTHROPLASTY Left 07/08/2015   Procedure: TOTAL KNEE ARTHROPLASTY WITH HARDWARE REMOVAL;  Surgeon: Melrose Nakayama, MD;  Location: [facility];  Service: Orthopedics;  Laterality: Left;   Social History   Social History Narrative  . No narrative on file     Objective: Vital Signs: There were no vitals taken for this visit.   Physical Exam   Musculoskeletal Exam: ***  CDAI Exam: No CDAI exam completed.    Investigation: Findings:  12/10/2016: ANA negative, DS DNA negative RF < 10, ESR 9, CRP 4.0, CBC normal, CMP normal,    Imaging: Dg Wrist 2 Views Right  Result Date: 03/15/2017 CLINICAL DATA:  Right hand and pain for 3 months.  No known injury. EXAM: RIGHT WRIST - 2 VIEW COMPARISON:  None. FINDINGS: There is no evidence of fracture or dislocation. There is no evidence of arthropathy or other focal bone abnormality. Soft tissues are unremarkable. IMPRESSION: Negative. Electronically Signed   By: Rolm Baptise M.D.   On: 03/15/2017 11:01   Dg Hand 2 View Right  Result Date: 03/15/2017 CLINICAL DATA:  Right hand and posterior wrist pain and swelling for 3 months. No known injury. EXAM: RIGHT HAND - 2 VIEW COMPARISON:  None. FINDINGS: No acute bony abnormality. Specifically, no fracture, subluxation, or dislocation. Soft tissues are intact. None joint spaces are maintained. No bony erosions. IMPRESSION: No acute bony abnormality. Electronically Signed   By: Rolm Baptise M.D.   On: 03/15/2017 11:01    Speciality Comments: No specialty comments available.    Procedures:  No  procedures performed Allergies: Patient has no known allergies.   Assessment / Plan:     Visit Diagnoses: No diagnosis found.   Orders: No orders of the defined types were placed in this encounter.  No orders of the defined types were placed in this encounter.   Face-to-face time spent with patient was *** minutes. 50% of time was spent in counseling and coordination of care.  Follow-Up Instructions:  No Follow-up on file.   Bo Merino, MD  Note - This record has been created using Editor, commissioning.  Chart creation errors have been sought, but may not always  have been located. Such creation errors do not reflect on  the standard of medical care.

## 2017-04-05 ENCOUNTER — Ambulatory Visit: Payer: Self-pay | Admitting: Rheumatology

## 2017-05-10 ENCOUNTER — Ambulatory Visit: Payer: Self-pay | Admitting: Rheumatology

## 2020-05-14 ENCOUNTER — Other Ambulatory Visit: Payer: Self-pay | Admitting: Internal Medicine

## 2020-05-14 ENCOUNTER — Ambulatory Visit
Admission: RE | Admit: 2020-05-14 | Discharge: 2020-05-14 | Disposition: A | Discharge status: Home / Self Care | Payer: Medicare Other | Source: Ambulatory Visit | Attending: Internal Medicine | Admitting: Internal Medicine

## 2020-05-14 DIAGNOSIS — R053 Chronic cough: Secondary | ICD-10-CM

## 2020-06-10 ENCOUNTER — Encounter: Payer: Self-pay | Admitting: Pulmonary Disease

## 2020-06-10 ENCOUNTER — Ambulatory Visit: Payer: Medicare Other | Admitting: Pulmonary Disease

## 2020-06-10 ENCOUNTER — Other Ambulatory Visit: Payer: Self-pay

## 2020-06-10 VITALS — BP 138/66 | HR 68 | Temp 97.8°F | Ht 71.0 in | Wt 170.2 lb

## 2020-06-10 DIAGNOSIS — R059 Cough, unspecified: Secondary | ICD-10-CM

## 2020-06-10 NOTE — Patient Instructions (Addendum)
Nice to meet you!  Continue the new heartburn or reflux medicine through mid November. If not better then, resume Breo 1 puff daily. Take this for a good 4 weeks or 1 month. If things are better we should continue this. If no better, we should get breathing tests and and possibly a second additional breathing test to further evaluate the possibility of something like asthma. If you need refills of this, please call us.  Please call the office in December and let me know if the cough is better or worse sow e can initiate the tests above.  Come back in 3 months for follow up with Dr. Silas Flood.   Notification of test results are managed in the following manner: If there are  any recommendations or changes to the  plan of care discussed in office today,  we will contact you and let you know what they are. If you do not hear from [place], then your results are normal and you can view them through your  MyChart account , or a letter will be sent to you. Thank you again for trusting [place] with your care  [facility].

## 2020-06-10 NOTE — Progress Notes (Signed)
@Patient  ID: Matthew Becker, male    DOB: [birth year], 69 y.o.   MRN: [id number]  Chief Complaint  Patient presents with  . Consult    Cough since June    Referring provider: Lavone Orn, MD  HPI:   This is a 69 year old man who are seen in consultation at the request of Lavone Orn, MD for evaluation of chronic cough. Notes from referring provider reviewed.  Seems like out of the blue, in June he developed onset of cough. No inciting event. Denies any significant upper respiratory illness or pneumonia. The cough initially dry. Over the last few weeks he does occasionally produce white to clear sputum. Cough present throughout the day. Did not seem to worsen at night or in the mornings. No clear exacerbating or alleviating factors otherwise. Trial antibiotics no improvement. Did receive a dose of steroids a few weeks ago and that seems to really improve the cough. Unfortunately, it has returned and similar to prior. He denies significant atopic symptoms, no significant seasonal allergies. No nasal congestion. No postnasal drip. Less than 2 weeks ago he switched to a PPI for empiric heartburn treatment. He has seen very mild improvement with this. No dysphagia or odynophagia. Cough not worse with eating and drinking.  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  MMRC: No flowsheet data found.  Epworth:  No flowsheet data found.  Tests:   FENO:  No results found for: NITRICOXIDE  PFT: No flowsheet data found.  WALK:  No flowsheet data found.  Imaging: Personally reviewed and as per EMR and discussion in this note DG  Chest 2 View  Result Date: 05/15/2020 CLINICAL DATA:  Chronic cough EXAM: CHEST - 2 VIEW COMPARISON:  06/25/2015 FINDINGS: The heart size and mediastinal contours are within normal limits. Both lungs are clear. The visualized skeletal structures are unremarkable. IMPRESSION: No active cardiopulmonary disease. Electronically Signed   By: Donavan Foil M.D.   On: 05/15/2020 21:51    Lab Results: Personally reviewed, no significant elevated eosinophils. CBC    Component Value Date/Time   WBC 6.3 06/25/2015 1129   RBC 4.90 06/25/2015 1129   HGB 16.3 06/25/2015 1129   HCT 47.4 06/25/2015 1129   PLT 189 06/25/2015 1129   MCV 96.7 06/25/2015 1129   MCH 33.3 06/25/2015 1129   MCHC 34.4 06/25/2015 1129   RDW 12.2 06/25/2015 1129   LYMPHSABS 1.5 06/25/2015 1129   MONOABS 0.5 06/25/2015 1129   EOSABS 0.1 06/25/2015 1129   BASOSABS 0.0 06/25/2015 1129    BMET    Component Value Date/Time   NA 138 06/25/2015 1129   K 4.1 06/25/2015 1129   CL 107 06/25/2015 1129   CO2 23 06/25/2015 1129   GLUCOSE 111 (H) 06/25/2015 1129   BUN 14 06/25/2015 1129   CREATININE 0.85 06/25/2015 1129   CALCIUM 9.2 06/25/2015 1129   GFRNONAA >60 06/25/2015 1129   GFRAA >60 06/25/2015 1129    BNP No results found for: BNP  ProBNP No results found for: PROBNP  Specialty Problems    None      No Known Allergies  Immunization History  Administered Date(s) Administered  . Moderna SARS-COVID-2 Vaccination 09/05/2019, 10/03/2019  Past Medical History:  Diagnosis Date  . GERD (gastroesophageal reflux disease)   . Hyperlipidemia     Tobacco History: Social History   Tobacco Use  Smoking Status Never Smoker  Smokeless Tobacco Never Used   Counseling given: Not Answered   Continue to not smoke  Outpatient Encounter Medications as of 06/10/2020  Medication Sig  . aspirin EC 325 MG EC tablet Take 1 tablet (325 mg total) by mouth 2 (two) times daily after a meal.  . atorvastatin  (LIPITOR) 20 MG tablet Take 20 mg by mouth every evening.   Marland Kitchen BREO ELLIPTA 100-25 MCG/INH AEPB SMARTSIG:1 Inhalation Via Inhaler Daily  . HYDROcodone-acetaminophen (NORCO/VICODIN) 5-325 MG tablet Take 1-2 tablets by mouth every 4 (four) hours as needed (breakthrough pain).  . methocarbamol (ROBAXIN) 500 MG tablet Take 1 tablet (500 mg total) by mouth every 6 (six) hours as needed for muscle spasms.  . pantoprazole (PROTONIX) 40 MG tablet Take 40 mg by mouth 2 (two) times daily.  Marland Kitchen PROAIR HFA 108 (90 Base) MCG/ACT inhaler Inhale 2 puffs into the lungs every 4 (four) hours as needed.  . vitamin B-12 (CYANOCOBALAMIN) 1000 MCG tablet Take 1,000 mcg by mouth daily.  . [DISCONTINUED] omeprazole (PRILOSEC) 20 MG capsule Take 20 mg by mouth daily.   No facility-administered encounter medications on file as of 06/10/2020.     Review of Systems  Review of Systems  No orthopnea or PND. No weight loss. No night sweats or fevers. Comprehensive review of systems otherwise negative.  Physical Exam  BP 138/66 (BP Location: Left Arm, Cuff Size: Normal)   Pulse 68   Temp 97.8 F (36.6 C) (Oral)   Ht 5\' 11"  (1.803 m)   Wt 170 lb 3.2 oz (77.2 kg)   SpO2 99%   BMI 23.74 kg/m   Wt Readings from Last 5 Encounters:  06/10/20 170 lb 3.2 oz (77.2 kg)  07/08/15 180 lb (81.6 kg)  06/25/15 180 lb (81.6 kg)  04/18/13 173 lb (78.5 kg)  03/20/13 172 lb 12.8 oz (78.4 kg)    BMI Readings from Last 5 Encounters:  06/10/20 23.74 kg/m  07/08/15 25.83 kg/m  06/25/15 25.83 kg/m  04/18/13 24.13 kg/m  03/20/13 24.10 kg/m     Physical Exam General: Well-appearing, no acute distress Eyes: EOMI, no icterus Mouth: Tongue midline, posterior pharynx clear, without erythema or cobblestoning Respiratory: Clear to auscultation bilaterally, no wheezing Cardiovascular: Regular rhythm, no murmurs Abdomen: Soft, bowel sounds present MSK: There is no lateral joint effusions Neuro: Normal gait, full range of  motion, no weakness Psych: Normal mood, full affect   Assessment & Plan:   Chronic cough: Present for several months. CXR 04/2020 clear. Most common causes are GERD, asthma, post nasal drip. Exam and symptoms not consistent with post nasal drip. Just switched PPI therapy with mild improvement. Recommend 1 month on protonix. If no significant improvement, recommend using Beo for 1 month for possible asthma. Suspect element of upper airway cough syndrome that can be difficult to treat.   Possible Asthma: Has seasonal allergies. Will trial Breo. If no better on Breo would obtain full PFT. If no significant bronchodilator response,  would pursue subsequent methacholine challenge to rule out asthma.    Return in about 3 months (around 09/10/2020).   Lanier Clam, MD 06/10/2020

## 2020-07-07 ENCOUNTER — Telehealth: Payer: Self-pay | Admitting: Pulmonary Disease

## 2020-07-07 DIAGNOSIS — R0600 Dyspnea, unspecified: Secondary | ICD-10-CM

## 2020-07-07 MED ORDER — BREO ELLIPTA 100-25 MCG/INH IN AEPB: INHALATION_SPRAY | RESPIRATORY_TRACT | 11 refills | Status: AC

## 2020-07-07 NOTE — Telephone Encounter (Signed)
Resume Breo. If need refills can provide. Can you order full PFTs? Next available, diagnosis dyspnea. Thanks!

## 2020-07-07 NOTE — Telephone Encounter (Signed)
Spoke with the pt  Refilled his Breo and scheduled for full PFT Pt aware to bring covid 19 vaccine card to his PFT  Nothing further needed

## 2020-07-07 NOTE — Telephone Encounter (Signed)
Spoke with the pt  He is calling to report that the pantoprazole 40 mg bid helped, but only minimally   AVS from 06/10/20 states:  Instructions    Return in about 3 months (around 09/10/2020). Nice to meet you!   Continue the new heartburn or reflux medicine through mid November. If not better then, resume Breo 1 puff daily. Take this for a good 4 weeks or 1 month. If things are better we should continue this. If no better, we should get breathing tests and and possibly a second additional breathing test to further evaluate the possibility of something like asthma. If you need refills of this, please call us.   Please call the office in December and let me know if the cough is better or worse sow e can initiate the tests above.   Come back in 3 months for follow up with Dr. Silas Flood.    Notification of test results are managed in the following manner: If there are  any recommendations or changes to the  plan of care discussed in office today,  we will contact you and let you know what they are. If you do not hear from [place], then your results are normal and you can view them through your  MyChart account , or a letter will be sent to you. Thank you again for trusting [place] with your care  [facility].       Dr Silas Flood, do you still want to go through with the plan of starting back the [facility] and ordering PFT's. I was not sure b/c the pt stated that the ppi did help some. Please advise, thanks!

## 2020-08-18 ENCOUNTER — Other Ambulatory Visit: Payer: Self-pay

## 2020-08-18 ENCOUNTER — Ambulatory Visit (INDEPENDENT_AMBULATORY_CARE_PROVIDER_SITE_OTHER): Payer: Medicare Other | Admitting: Pulmonary Disease

## 2020-08-18 DIAGNOSIS — R0600 Dyspnea, unspecified: Secondary | ICD-10-CM

## 2020-08-18 LAB — PULMONARY FUNCTION TEST
DL/VA % pred: 115 %
DL/VA: 4.67 ml/min/mmHg/L
DLCO cor % pred: 93 %
DLCO cor: 24.21 ml/min/mmHg
DLCO unc % pred: 93 %
DLCO unc: 24.21 ml/min/mmHg
FEF 25-75 Post: 3.77 L/sec
FEF 25-75 Pre: 3.54 L/sec
FEF2575-%Change-Post: 6 %
FEF2575-%Pred-Post: 152 %
FEF2575-%Pred-Pre: 143 %
FEV1-%Change-Post: 0 %
FEV1-%Pred-Post: 93 %
FEV1-%Pred-Pre: 92 %
FEV1-Post: 3.04 L
FEV1-Pre: 3.03 L
FEV1FVC-%Change-Post: 4 %
FEV1FVC-%Pred-Pre: 110 %
FEV6-%Change-Post: -4 %
FEV6-%Pred-Post: 85 %
FEV6-%Pred-Pre: 89 %
FEV6-Post: 3.57 L
FEV6-Pre: 3.73 L
FEV6FVC-%Change-Post: 0 %
FEV6FVC-%Pred-Post: 105 %
FEV6FVC-%Pred-Pre: 105 %
FVC-%Change-Post: -3 %
FVC-%Pred-Post: 80 %
FVC-%Pred-Pre: 84 %
FVC-Post: 3.58 L
FVC-Pre: 3.73 L
Post FEV1/FVC ratio: 85 %
Post FEV6/FVC ratio: 100 %
Pre FEV1/FVC ratio: 81 %
Pre FEV6/FVC Ratio: 100 %
RV % pred: 91 %
RV: 2.25 L
TLC % pred: 83 %
TLC: 5.89 L

## 2020-08-18 NOTE — Progress Notes (Signed)
PFT done today. 

## 2020-09-10 ENCOUNTER — Encounter: Payer: Self-pay | Admitting: Pulmonary Disease

## 2020-09-10 ENCOUNTER — Ambulatory Visit: Payer: Medicare Other | Admitting: Pulmonary Disease

## 2020-09-10 ENCOUNTER — Other Ambulatory Visit: Payer: Self-pay

## 2020-09-10 VITALS — BP 118/72 | HR 63 | Temp 97.0°F | Ht 71.0 in | Wt 167.6 lb

## 2020-09-10 DIAGNOSIS — R059 Cough, unspecified: Secondary | ICD-10-CM

## 2020-09-10 DIAGNOSIS — Z8719 Personal history of other diseases of the digestive system: Secondary | ICD-10-CM

## 2020-09-10 NOTE — Patient Instructions (Addendum)
Nice to see you again  I am glad the cough is better  Continue Breo as prescribed.   Come back in 5 months to see Dr. Silas Flood in follow up - if coughremains gone we will plan to stop Breo after the visit

## 2020-09-10 NOTE — Progress Notes (Signed)
@Patient  ID: Matthew Becker, male    DOB: [birth year], 70 y.o.   MRN: [id number]  Chief Complaint  Patient presents with  . Follow-up    F/U after PFT for cough. States his cough is completely gone. Denies any SOB.     Referring provider: Lavone Orn, MD  HPI:   This is a 70 year old man we are seeing in follow up for chronic cough.  Returns today. PPI therapy had mild improvement in cough. Resumed Breo late 06/2020. Great improvement in cough, now gone.  PFTs 08/18/20 reviewed and discussed with patient - normal PFTs, no bronchodilator response, TLC WNL, DLCO WNL.  HPI at initial visit: Seems like out of the blue, in June he developed onset of cough. No inciting event. Denies any significant upper respiratory illness or pneumonia. The cough initially dry. Over the last few weeks he does occasionally produce white to clear sputum. Cough present throughout the day. Did not seem to worsen at night or in the mornings. No clear exacerbating or alleviating factors otherwise. Trial antibiotics no improvement. Did receive a dose of steroids a few weeks ago and that seems to really improve the cough. Unfortunately, it has returned and similar to prior. He denies significant atopic symptoms, no significant seasonal allergies. No nasal congestion. No postnasal drip. Less than 2 weeks ago he switched to a PPI for empiric heartburn treatment. He has seen very mild improvement with this. No dysphagia or odynophagia. Cough not worse with eating and drinking.  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  MMRC: No flowsheet data found.  Epworth:   No flowsheet data found.  Tests:   FENO:  No results found for: NITRICOXIDE  PFT: PFT Results Latest Ref Rng & Units 08/18/2020  FVC-Pre L 3.73  FVC-Predicted Pre % 84  FVC-Post L 3.58  FVC-Predicted Post % 80  Pre FEV1/FVC % % 81  Post FEV1/FCV % % 85  FEV1-Pre L 3.03  FEV1-Predicted Pre % 92  FEV1-Post L 3.04  DLCO uncorrected ml/min/mmHg 24.21  DLCO UNC% % 93  DLCO corrected ml/min/mmHg 24.21  DLCO COR %Predicted % 93  DLVA Predicted % 115  TLC L 5.89  TLC % Predicted % 83  RV % Predicted % 91  Personally reviewed and interpreted as normal PFTs, no bronchodilator response.   WALK:  No flowsheet data found.  Imaging: Personally reviewed and as per EMR and discussion in this note No results found.  Lab Results: Personally reviewed, no significant elevated eosinophils. CBC    Component Value Date/Time   WBC 6.3 06/25/2015 1129   RBC 4.90 06/25/2015 1129   HGB 16.3 06/25/2015 1129   HCT 47.4 06/25/2015 1129   PLT 189 06/25/2015 1129   MCV 96.7 06/25/2015 1129   MCH 33.3 06/25/2015 1129   MCHC 34.4 06/25/2015 1129   RDW 12.2 06/25/2015 1129   LYMPHSABS 1.5 06/25/2015 1129   MONOABS 0.5 06/25/2015 1129   EOSABS 0.1 06/25/2015 1129   BASOSABS 0.0 06/25/2015 1129    BMET    Component Value Date/Time   NA 138 06/25/2015 1129   K 4.1 06/25/2015 1129   CL 107 06/25/2015 1129   CO2 23 06/25/2015 1129  GLUCOSE 111 (H) 06/25/2015 1129   BUN 14 06/25/2015 1129   CREATININE 0.85 06/25/2015 1129   CALCIUM 9.2 06/25/2015 1129   GFRNONAA >60 06/25/2015 1129   GFRAA >60 06/25/2015 1129    BNP No results found for: BNP  ProBNP No results found for: PROBNP  Specialty Problems   None     No Known Allergies  Immunization History  Administered Date(s) Administered  . Moderna Sars-Covid-2 Vaccination 09/05/2019, 10/03/2019    Past Medical History:  Diagnosis Date  . GERD (gastroesophageal reflux disease)   . Hyperlipidemia     Tobacco  History: Social History   Tobacco Use  Smoking Status Never Smoker  Smokeless Tobacco Never Used   Counseling given: Not Answered   Continue to not smoke  Outpatient Encounter Medications as of 09/10/2020  Medication Sig  . atorvastatin (LIPITOR) 20 MG tablet Take 20 mg by mouth every evening.   Marland Kitchen BREO ELLIPTA 100-25 MCG/INH AEPB SMARTSIG:1 Inhalation Via Inhaler Daily  . pantoprazole (PROTONIX) 40 MG tablet Take 40 mg by mouth 2 (two) times daily.  . vitamin B-12 (CYANOCOBALAMIN) 1000 MCG tablet Take 1,000 mcg by mouth daily.  . [DISCONTINUED] aspirin EC 325 MG EC tablet Take 1 tablet (325 mg total) by mouth 2 (two) times daily after a meal.  . [DISCONTINUED] HYDROcodone-acetaminophen (NORCO/VICODIN) 5-325 MG tablet Take 1-2 tablets by mouth every 4 (four) hours as needed (breakthrough pain).  . [DISCONTINUED] methocarbamol (ROBAXIN) 500 MG tablet Take 1 tablet (500 mg total) by mouth every 6 (six) hours as needed for muscle spasms.  . [DISCONTINUED] PROAIR HFA 108 (90 Base) MCG/ACT inhaler Inhale 2 puffs into the lungs every 4 (four) hours as needed.   No facility-administered encounter medications on file as of 09/10/2020.     Review of Systems  Review of Systems  Right ear tingling without true pain, tinnitus, hearing loss. Comprehensive review of systems otherwise negative.  Physical Exam  BP 118/72   Pulse 63   Temp (!) 97 F (36.1 C) (Temporal)   Ht 5\' 11"  (1.803 m)   Wt 167 lb 9.6 oz (76 kg)   SpO2 100% Comment: on RA  BMI 23.38 kg/m   Wt Readings from Last 5 Encounters:  09/10/20 167 lb 9.6 oz (76 kg)  06/10/20 170 lb 3.2 oz (77.2 kg)  07/08/15 180 lb (81.6 kg)  06/25/15 180 lb (81.6 kg)  04/18/13 173 lb (78.5 kg)    BMI Readings from Last 5 Encounters:  09/10/20 23.38 kg/m  06/10/20 23.74 kg/m  07/08/15 25.83 kg/m  06/25/15 25.83 kg/m  04/18/13 24.13 kg/m     Physical Exam General: Well-appearing, no acute distress Eyes: EOMI, no  icterus Ears: R ear canal clear without erythema, minimal wax, TM with good light reflex, normal, mild flaky skin at entrance to external canal Respiratory: Clear to auscultation bilaterally, no wheezing Neuro: Normal gait, full range of motion, no weakness Psych: Normal mood, full affect   Assessment & Plan:   Chronic cough: Present for several months. CXR 04/2020 clear. Most common causes are GERD, asthma, post nasal drip. Exam and symptoms not consistent with post nasal drip. PPI therapy without significant improvement. Has resolved on ICS/LABA. Continue for now, reassess in summer and consider d/c ICS/LABA and assess response.  Possible Asthma: Has seasonal allergies. Resumed Breo. Cough better.   Return in about 5 months (around 02/08/2021).   Lanier Clam, MD 09/10/2020

## 2020-09-23 ENCOUNTER — Other Ambulatory Visit: Payer: Self-pay | Admitting: Otolaryngology

## 2020-09-23 DIAGNOSIS — H9311 Tinnitus, right ear: Secondary | ICD-10-CM | POA: Diagnosis not present

## 2020-09-23 DIAGNOSIS — H918X9 Other specified hearing loss, unspecified ear: Secondary | ICD-10-CM

## 2020-09-23 DIAGNOSIS — H903 Sensorineural hearing loss, bilateral: Secondary | ICD-10-CM | POA: Diagnosis not present

## 2020-10-13 ENCOUNTER — Ambulatory Visit
Admission: RE | Admit: 2020-10-13 | Discharge: 2020-10-13 | Disposition: A | Discharge status: Home / Self Care | Payer: Medicare Other | Source: Ambulatory Visit | Attending: Otolaryngology | Admitting: Otolaryngology

## 2020-10-13 DIAGNOSIS — J3489 Other specified disorders of nose and nasal sinuses: Secondary | ICD-10-CM | POA: Diagnosis not present

## 2020-10-13 DIAGNOSIS — H9041 Sensorineural hearing loss, unilateral, right ear, with unrestricted hearing on the contralateral side: Secondary | ICD-10-CM | POA: Diagnosis not present

## 2020-10-13 DIAGNOSIS — H918X9 Other specified hearing loss, unspecified ear: Secondary | ICD-10-CM

## 2020-10-13 MED ORDER — GADOBENATE DIMEGLUMINE 529 MG/ML IV SOLN
15.0000 mL | Freq: Once | INTRAVENOUS | Status: AC | PRN
  Administered 2020-10-13: 15 mL via INTRAVENOUS

## 2021-01-09 DIAGNOSIS — L03032 Cellulitis of left toe: Secondary | ICD-10-CM | POA: Diagnosis not present

## 2021-01-09 DIAGNOSIS — R030 Elevated blood-pressure reading, without diagnosis of hypertension: Secondary | ICD-10-CM | POA: Diagnosis not present

## 2021-01-14 DIAGNOSIS — K219 Gastro-esophageal reflux disease without esophagitis: Secondary | ICD-10-CM | POA: Diagnosis not present

## 2021-01-14 DIAGNOSIS — Z1159 Encounter for screening for other viral diseases: Secondary | ICD-10-CM | POA: Diagnosis not present

## 2021-01-14 DIAGNOSIS — Z1389 Encounter for screening for other disorder: Secondary | ICD-10-CM | POA: Diagnosis not present

## 2021-01-14 DIAGNOSIS — Z0000 Encounter for general adult medical examination without abnormal findings: Secondary | ICD-10-CM | POA: Diagnosis not present

## 2021-01-14 DIAGNOSIS — L409 Psoriasis, unspecified: Secondary | ICD-10-CM | POA: Diagnosis not present

## 2021-01-14 DIAGNOSIS — M1990 Unspecified osteoarthritis, unspecified site: Secondary | ICD-10-CM | POA: Diagnosis not present

## 2021-01-14 DIAGNOSIS — R030 Elevated blood-pressure reading, without diagnosis of hypertension: Secondary | ICD-10-CM | POA: Diagnosis not present

## 2021-01-14 DIAGNOSIS — R053 Chronic cough: Secondary | ICD-10-CM | POA: Diagnosis not present

## 2021-01-14 DIAGNOSIS — E7800 Pure hypercholesterolemia, unspecified: Secondary | ICD-10-CM | POA: Diagnosis not present

## 2021-10-02 DIAGNOSIS — M545 Low back pain, unspecified: Secondary | ICD-10-CM | POA: Diagnosis not present

## 2021-11-09 DIAGNOSIS — R233 Spontaneous ecchymoses: Secondary | ICD-10-CM | POA: Diagnosis not present

## 2022-01-17 IMAGING — MR MR HEAD WO/W CM
12 of 13 series · 40 of 48 positions shown · IV contrast (multihance)
Comparison: None.

CLINICAL DATA: Asymmetric hearing loss right ear

EXAM:
MRI HEAD WITHOUT AND WITH CONTRAST
TECHNIQUE: Multiplanar, multiecho pulse sequences of the brain and surrounding
structures were obtained without and with intravenous contrast.
CONTRAST:  15mL MULTIHANCE GADOBENATE DIMEGLUMINE 529 MG/ML IV SOLN

[Series 2: T1 · sagittal · 5.0mm · 0.45mm/px · 3 of 23 slices shown (1 of 3)]
[im 1/23]
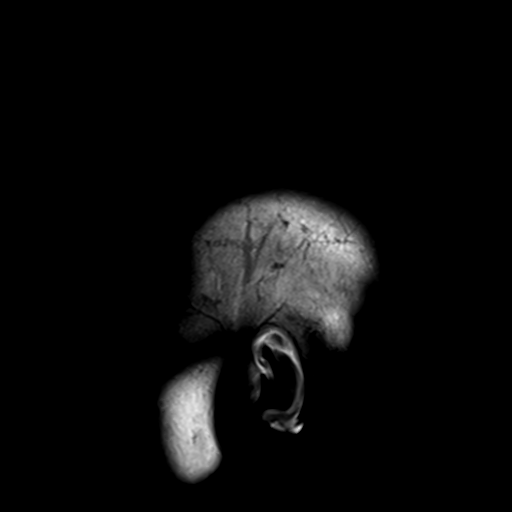
[im 12/23]
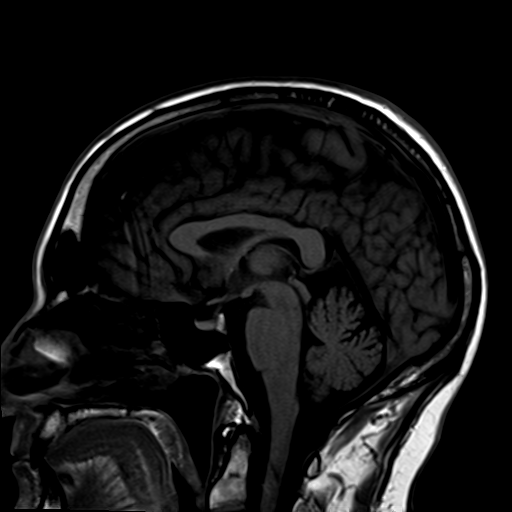
[im 23/23]
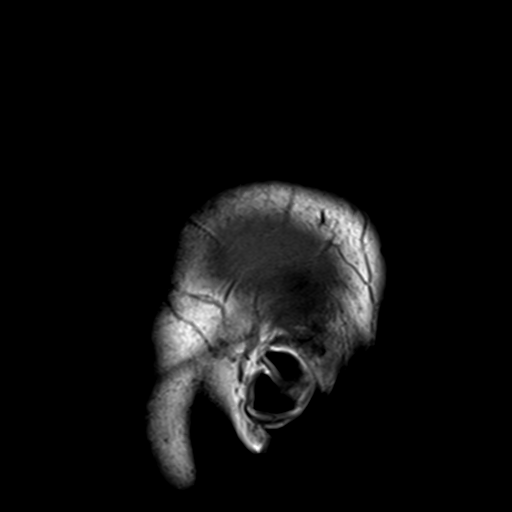

[Series 3: DWI · axial · 3.0mm · 1.80mm/px · z∈[-43,+103]mm · 11 of 100 slices shown (1 of 2)]
[im 1/100]
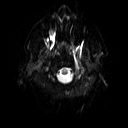
[im 10/100]
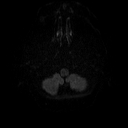
[im 20/100]
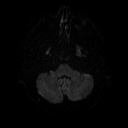
[im 30/100]
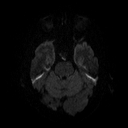
[im 40/100]
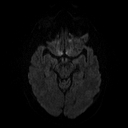
[im 50/100]
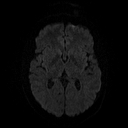
[im 60/100]
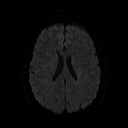
[im 70/100]
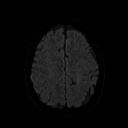
[im 80/100]
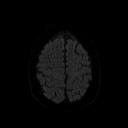
[im 90/100]
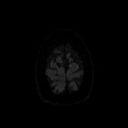
[im 100/100]
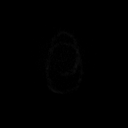

[Series 4: DWI · axial · 3.0mm · 1.80mm/px · z∈[-43,+103]mm · 5 of 49 slices shown (2 of 2)]
[im 1/49]
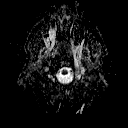
[im 13/49]
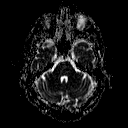
[im 25/49]
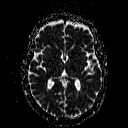
[im 37/49]
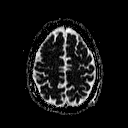
[im 49/49]
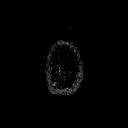

[Series 5: T2 · axial · 5.0mm · 0.45mm/px · z∈[-41,+101]mm · 2 of 23 slices shown]
[im 1/23]
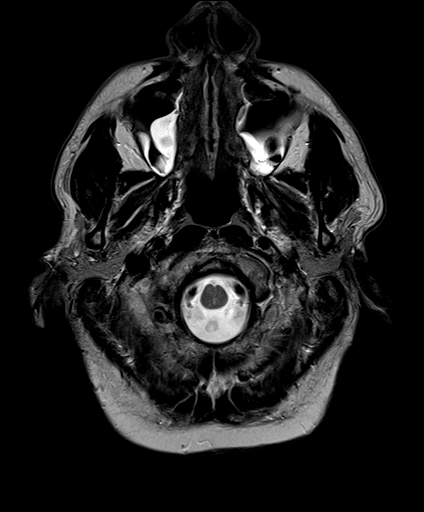
[im 23/23]
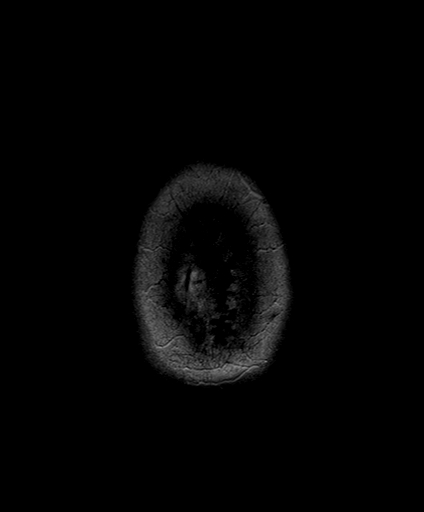

[Series 6: FLAIR · axial · 3.0mm · 0.45mm/px · z∈[-47,+108]mm · 3 of 27 slices shown]
[im 1/27]
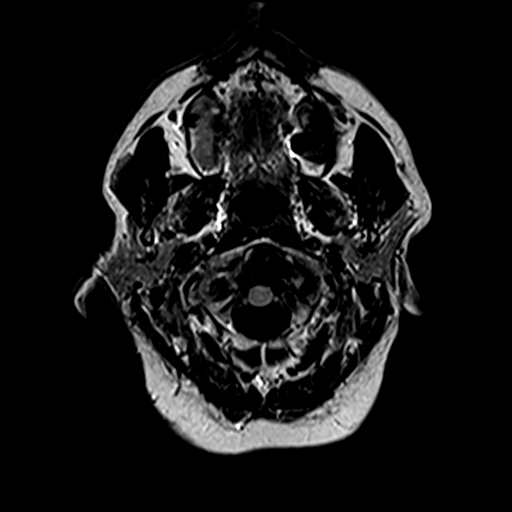
[im 14/27]
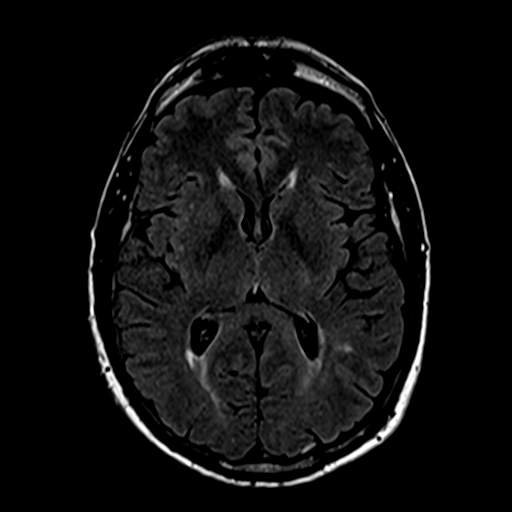
[im 27/27]
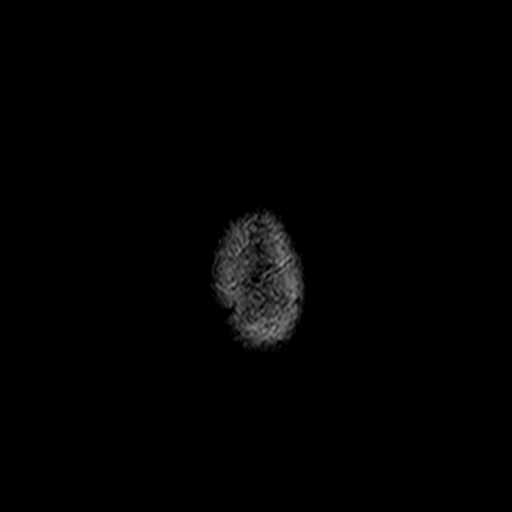

[Series 7: mip_images(sw) · axial · 24.0mm · 0.90mm/px · z∈[-30,+89]mm · 4 of 41 slices shown]
[im 1/41]
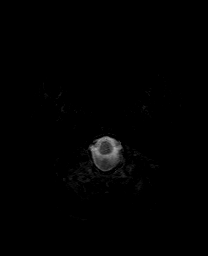
[im 14/41]
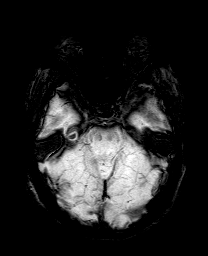
[im 27/41]
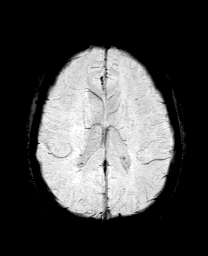
[im 41/41]
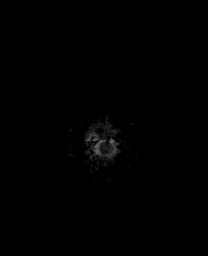

[Series 8: swi_images · axial · 3.0mm · 0.90mm/px · z∈[-41,+100]mm · 5 of 48 slices shown]
[im 1/48]
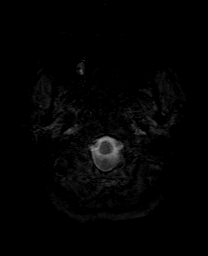
[im 12/48]
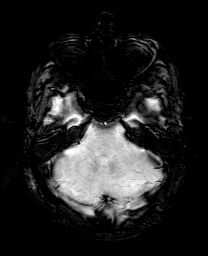
[im 24/48]
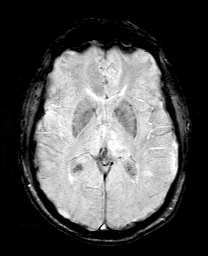
[im 36/48]
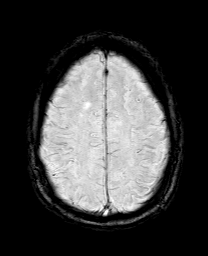
[im 48/48]
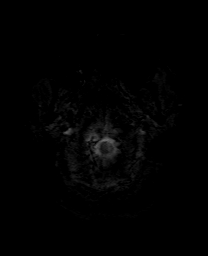

[Series 9: T1 · coronal · 3.0mm · 0.35mm/px · 1 of 11 slices shown (2 of 3)]
[im 1/11]
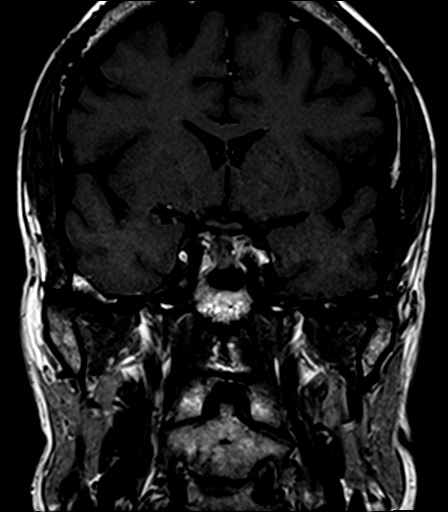

[Series 10: T1 · axial · 3.0mm · 0.35mm/px · 1 of 11 slices shown (3 of 3)]
[im 1/11]
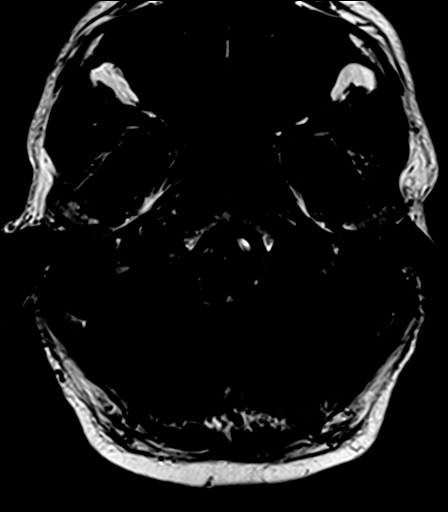

[Series 11: bSSFP · axial · 1.0mm · 0.28mm/px · z∈[-36,-13]mm · 3 of 36 slices shown]
[im 1/36]
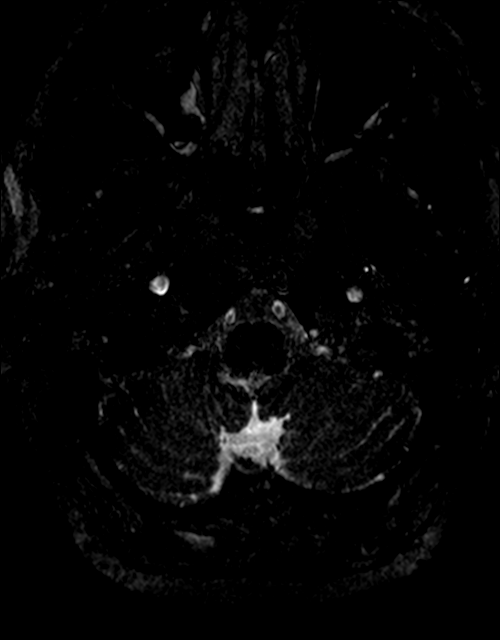
[im 12/36]
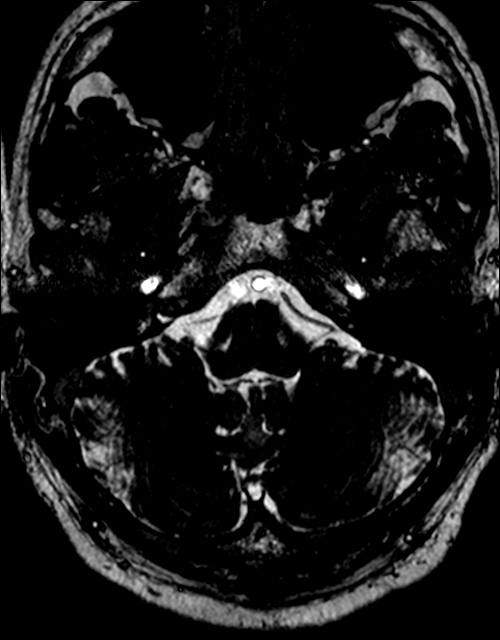
[im 24/36]
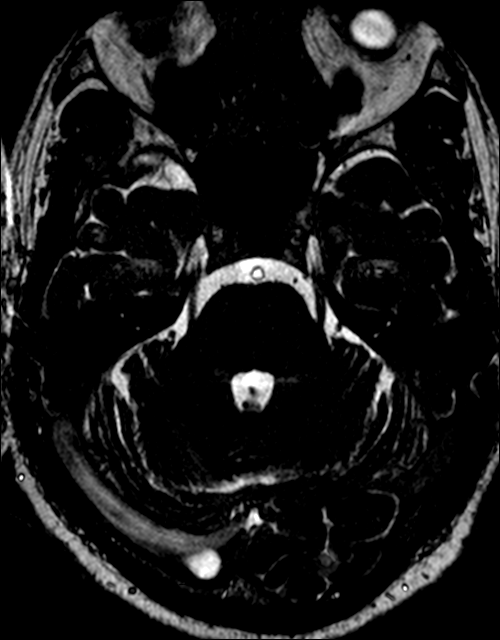

[Series 12: T1 post-contrast · coronal · 3.0mm · 0.35mm/px · 1 of 11 slices shown (1 of 2)]
[im 1/11]
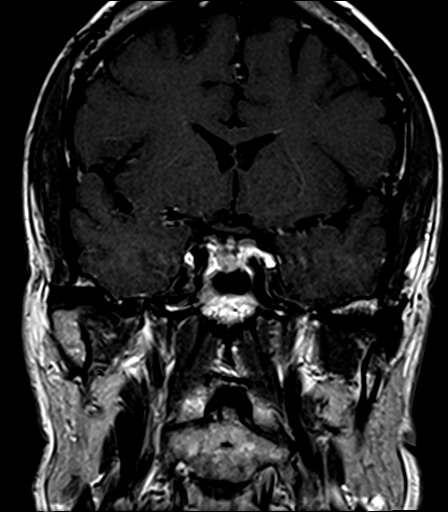

[Series 13: T1 post-contrast · axial · 3.0mm · 0.35mm/px · 1 of 11 slices shown (2 of 2)]
[im 1/11]
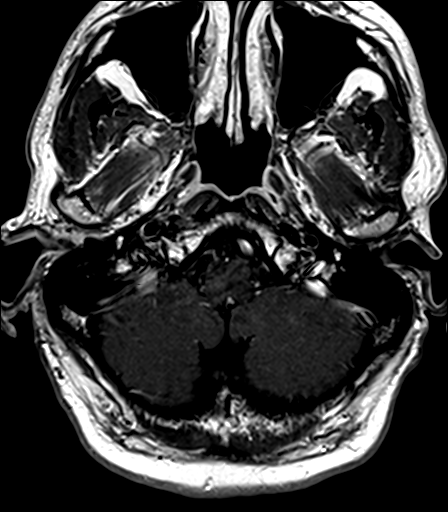

[40 of 48 positions shown; findings below may reference images not displayed]

FINDINGS: Brain: IAC protocol. Seventh and eighth cranial nerves normal.
Negative for vestibular schwannoma. Basilar cisterns normal.
Brainstem and cerebellum normal. Mastoid sinus clear bilaterally.
Normal enhancement in the temporal bone bilaterally.

Ventricle size normal. Cerebral volume normal. Scattered
periventricular deep white matter hyperintensities bilaterally. No
acute infarct or hemorrhage. No mass lesion. Normal enhancement of
the brain.

Vascular: Normal arterial flow voids.

Skull and upper cervical spine: Negative

Sinuses/Orbits: Mild mucosal edema paranasal sinuses. Negative orbit

Other: None
IMPRESSION: No cause for hearing loss. Specifically negative for vestibular
schwannoma.

Mild white matter changes likely due to chronic microvascular
ischemia. Demyelinating disease also in the differential.

## 2022-01-21 DIAGNOSIS — L989 Disorder of the skin and subcutaneous tissue, unspecified: Secondary | ICD-10-CM | POA: Diagnosis not present

## 2022-01-21 DIAGNOSIS — K219 Gastro-esophageal reflux disease without esophagitis: Secondary | ICD-10-CM | POA: Diagnosis not present

## 2022-01-21 DIAGNOSIS — M545 Low back pain, unspecified: Secondary | ICD-10-CM | POA: Diagnosis not present

## 2022-01-21 DIAGNOSIS — I10 Essential (primary) hypertension: Secondary | ICD-10-CM | POA: Diagnosis not present

## 2022-01-21 DIAGNOSIS — G8929 Other chronic pain: Secondary | ICD-10-CM | POA: Diagnosis not present

## 2022-01-21 DIAGNOSIS — E7800 Pure hypercholesterolemia, unspecified: Secondary | ICD-10-CM | POA: Diagnosis not present

## 2022-01-21 DIAGNOSIS — Z0000 Encounter for general adult medical examination without abnormal findings: Secondary | ICD-10-CM | POA: Diagnosis not present

## 2022-01-26 DIAGNOSIS — L578 Other skin changes due to chronic exposure to nonionizing radiation: Secondary | ICD-10-CM | POA: Diagnosis not present

## 2022-01-26 DIAGNOSIS — C44519 Basal cell carcinoma of skin of other part of trunk: Secondary | ICD-10-CM | POA: Diagnosis not present

## 2022-01-26 DIAGNOSIS — C4441 Basal cell carcinoma of skin of scalp and neck: Secondary | ICD-10-CM | POA: Diagnosis not present

## 2022-01-26 DIAGNOSIS — L719 Rosacea, unspecified: Secondary | ICD-10-CM | POA: Diagnosis not present

## 2022-01-26 DIAGNOSIS — D225 Melanocytic nevi of trunk: Secondary | ICD-10-CM | POA: Diagnosis not present

## 2022-03-30 DIAGNOSIS — L820 Inflamed seborrheic keratosis: Secondary | ICD-10-CM | POA: Diagnosis not present

## 2022-03-30 DIAGNOSIS — L719 Rosacea, unspecified: Secondary | ICD-10-CM | POA: Diagnosis not present

## 2022-07-16 DIAGNOSIS — R42 Dizziness and giddiness: Secondary | ICD-10-CM | POA: Diagnosis not present

## 2022-07-16 DIAGNOSIS — I10 Essential (primary) hypertension: Secondary | ICD-10-CM | POA: Diagnosis not present

## 2022-07-16 DIAGNOSIS — H8110 Benign paroxysmal vertigo, unspecified ear: Secondary | ICD-10-CM | POA: Diagnosis not present

## 2022-07-19 ENCOUNTER — Other Ambulatory Visit: Payer: Self-pay | Admitting: Internal Medicine

## 2022-07-19 DIAGNOSIS — H8110 Benign paroxysmal vertigo, unspecified ear: Secondary | ICD-10-CM

## 2022-08-12 ENCOUNTER — Ambulatory Visit
Admission: RE | Admit: 2022-08-12 | Discharge: 2022-08-12 | Disposition: A | Discharge status: Home / Self Care | Payer: Medicare Other | Source: Ambulatory Visit | Attending: Internal Medicine | Admitting: Internal Medicine

## 2022-08-12 DIAGNOSIS — H8110 Benign paroxysmal vertigo, unspecified ear: Secondary | ICD-10-CM

## 2022-08-12 DIAGNOSIS — R42 Dizziness and giddiness: Secondary | ICD-10-CM | POA: Diagnosis not present

## 2022-08-12 MED ORDER — GADOPICLENOL 0.5 MMOL/ML IV SOLN
7.5000 mL | Freq: Once | INTRAVENOUS | Status: AC | PRN
  Administered 2022-08-12: 7.5 mL via INTRAVENOUS

## 2022-12-17 DIAGNOSIS — B9689 Other specified bacterial agents as the cause of diseases classified elsewhere: Secondary | ICD-10-CM | POA: Diagnosis not present

## 2022-12-17 DIAGNOSIS — Z0000 Encounter for general adult medical examination without abnormal findings: Secondary | ICD-10-CM | POA: Diagnosis not present

## 2022-12-17 DIAGNOSIS — J208 Acute bronchitis due to other specified organisms: Secondary | ICD-10-CM | POA: Diagnosis not present

## 2023-01-27 DIAGNOSIS — K219 Gastro-esophageal reflux disease without esophagitis: Secondary | ICD-10-CM | POA: Diagnosis not present

## 2023-01-27 DIAGNOSIS — E7800 Pure hypercholesterolemia, unspecified: Secondary | ICD-10-CM | POA: Diagnosis not present

## 2023-01-27 DIAGNOSIS — I10 Essential (primary) hypertension: Secondary | ICD-10-CM | POA: Diagnosis not present

## 2023-08-28 DIAGNOSIS — S2241XA Multiple fractures of ribs, right side, initial encounter for closed fracture: Secondary | ICD-10-CM | POA: Diagnosis not present

## 2023-08-28 DIAGNOSIS — M545 Low back pain, unspecified: Secondary | ICD-10-CM | POA: Diagnosis not present

## 2023-08-28 DIAGNOSIS — W010XXA Fall on same level from slipping, tripping and stumbling without subsequent striking against object, initial encounter: Secondary | ICD-10-CM | POA: Diagnosis not present

## 2023-08-28 DIAGNOSIS — S2231XA Fracture of one rib, right side, initial encounter for closed fracture: Secondary | ICD-10-CM | POA: Diagnosis not present

## 2023-09-08 DIAGNOSIS — S2231XA Fracture of one rib, right side, initial encounter for closed fracture: Secondary | ICD-10-CM | POA: Diagnosis not present

## 2023-09-08 DIAGNOSIS — S2241XA Multiple fractures of ribs, right side, initial encounter for closed fracture: Secondary | ICD-10-CM | POA: Diagnosis not present

## 2023-09-19 DIAGNOSIS — H3589 Other specified retinal disorders: Secondary | ICD-10-CM | POA: Diagnosis not present

## 2023-09-19 DIAGNOSIS — H524 Presbyopia: Secondary | ICD-10-CM | POA: Diagnosis not present

## 2023-10-11 DIAGNOSIS — S2241XA Multiple fractures of ribs, right side, initial encounter for closed fracture: Secondary | ICD-10-CM | POA: Diagnosis not present

## 2023-10-11 DIAGNOSIS — S2231XA Fracture of one rib, right side, initial encounter for closed fracture: Secondary | ICD-10-CM | POA: Diagnosis not present

## 2023-10-21 DIAGNOSIS — J019 Acute sinusitis, unspecified: Secondary | ICD-10-CM | POA: Diagnosis not present

## 2023-10-21 DIAGNOSIS — J40 Bronchitis, not specified as acute or chronic: Secondary | ICD-10-CM | POA: Diagnosis not present

## 2024-01-11 DIAGNOSIS — S2241XD Multiple fractures of ribs, right side, subsequent encounter for fracture with routine healing: Secondary | ICD-10-CM | POA: Diagnosis not present

## 2024-02-19 ENCOUNTER — Encounter (HOSPITAL_BASED_OUTPATIENT_CLINIC_OR_DEPARTMENT_OTHER): Payer: Self-pay

## 2024-02-19 ENCOUNTER — Ambulatory Visit (HOSPITAL_BASED_OUTPATIENT_CLINIC_OR_DEPARTMENT_OTHER): Admission: EM | Admit: 2024-02-19 | Discharge: 2024-02-19 | Disposition: A | Discharge status: Home / Self Care

## 2024-02-19 DIAGNOSIS — J019 Acute sinusitis, unspecified: Secondary | ICD-10-CM | POA: Diagnosis not present

## 2024-02-19 MED ORDER — AMOXICILLIN 875 MG PO TABS: 875.0000 mg | ORAL_TABLET | Freq: Two times a day (BID) | ORAL | 0 refills | Status: AC

## 2024-02-19 NOTE — Discharge Instructions (Signed)
 Take the antibiotics prescribed for sinus infection.  You can continue over-the-counter medication as needed.  Keep an eye on your blood pressure.  Follow-up as needed

## 2024-02-19 NOTE — ED Triage Notes (Signed)
 2 weeks with cough. Was on a cruise when symptoms first started 2 weeks ago. The couple they were with, both tested positive for covid. Patient's spouse tested positive 3 days ago with a negative test yesterday. Patient has been tested twice for covid and influenza since arriving home with most recent test being yesterday. Test negative both times. + Sinus drainage.Taking dayquil tablets for symptoms.

## 2024-02-19 NOTE — ED Provider Notes (Signed)
 [facility]    CSN: [id number] Arrival date & time: 02/19/24  0843      History   Chief Complaint Chief Complaint  Patient presents with   Cough    HPI Matthew Becker is a 73 y.o. male.   73 year old male presents today with approximately 2 weeks of nasal congestion, postnasal drip.  Symptoms been constant.  He has been taking over-the-counter DayQuil and NyQuil with temporary relief.  They recently travel.  Was exposed to COVID but had negative testing.  No fevers, chills, cough or chest congestion.  Otherwise feels okay   Cough   Past Medical History:  Diagnosis Date   GERD (gastroesophageal reflux disease)    Hyperlipidemia     Patient Active Problem List   Diagnosis Date Noted   Dyslipidemia 04/04/2017   Varicose veins of both lower extremities 04/04/2017   History of gastroesophageal reflux (GERD) 04/04/2017   History of BPH 04/04/2017   Transverse myelitis (HCC) 1979 04/04/2017   Primary osteoarthritis of left knee 07/08/2015   Primary osteoarthritis of knee 07/08/2015   S/P right inguinal hernia repair 04/18/2013    Past Surgical History:  Procedure Laterality Date   abdominal abcess surgery     ABDOMINAL SURGERY     ANTERIOR CRUCIATE LIGAMENT REPAIR     1974   INGUINAL HERNIA REPAIR Right 03/23/2013   Procedure: HERNIA REPAIR INGUINAL ADULT;  Surgeon: Donnice KATHEE Lunger, MD;  Location: [facility];  Service: General;  Laterality: Right;  With Mesh   KNEE ARTHROSCOPY     rt   TOTAL KNEE ARTHROPLASTY Left 07/08/2015   Procedure: TOTAL KNEE ARTHROPLASTY WITH HARDWARE REMOVAL;  Surgeon: Maude Herald, MD;  Location: [facility];  Service: Orthopedics;  Laterality: Left;       Home Medications    Prior to Admission medications   Medication Sig Start Date End Date Taking? Authorizing Provider  amoxicillin  (AMOXIL ) 875 MG tablet Take 1 tablet (875 mg total) by mouth 2 (two) times daily for 7 days. 02/19/24 02/26/24 Yes Trenden Hazelrigg A, FNP  amLODipine  (NORVASC) 5 MG tablet Take 5 mg by mouth daily.    [provider]  atorvastatin  (LIPITOR) 20 MG tablet Take 20 mg by mouth every evening.     [provider]  BREO ELLIPTA  100-25 MCG/INH AEPB SMARTSIG:1 Inhalation Via Inhaler Daily 07/07/20   Hunsucker, Donnice SAUNDERS, MD  pantoprazole  (PROTONIX ) 40 MG tablet Take 40 mg by mouth 2 (two) times daily. 05/23/20   [provider]  vitamin B-12 (CYANOCOBALAMIN ) 1000 MCG tablet Take 1,000 mcg by mouth daily.    [provider]    Family History History reviewed. No pertinent family history.  Social History Social History   Tobacco Use   Smoking status: Never   Smokeless tobacco: Never  Substance Use Topics   Alcohol use: Yes    Alcohol/week: 5.0 standard drinks of alcohol    Types: 5 Glasses of wine per week   Drug use: No     Allergies   Patient has no known allergies.   Review of Systems Review of Systems  Respiratory:  Positive for cough.    See HPI  Physical Exam Triage Vital Signs ED Triage Vitals  Encounter Vitals Group     BP 02/19/24 0854 (!) 180/82     Girls Systolic BP Percentile --      Girls Diastolic BP Percentile --      Boys Systolic BP Percentile --  Boys Diastolic BP Percentile --      Pulse Rate 02/19/24 0854 70     Resp 02/19/24 0854 20     Temp 02/19/24 0854 98 F (36.7 C)     Temp Source 02/19/24 0854 Oral     SpO2 02/19/24 0854 96 %     Weight --      Height --      Head Circumference --      Peak Flow --      Pain Score 02/19/24 0856 0     Pain Loc --      Pain Education --      Exclude from Growth Chart --    No data found.  Updated Vital Signs BP (!) 180/82 (BP Location: Right Arm)   Pulse 70   Temp 98 F (36.7 C) (Oral)   Resp 20   SpO2 96%   Visual Acuity Right Eye Distance:   Left Eye Distance:   Bilateral Distance:    Right Eye Near:   Left Eye Near:    Bilateral Near:     Physical Exam Constitutional:      General: He is not in  acute distress.    Appearance: Normal appearance. He is not ill-appearing, toxic-appearing or diaphoretic.  HENT:     Right Ear: Tympanic membrane, ear canal and external ear normal.     Left Ear: Tympanic membrane, ear canal and external ear normal.     Nose:     Comments: PND    Mouth/Throat:     Pharynx: Oropharynx is clear.  Eyes:     Conjunctiva/sclera: Conjunctivae normal.  Cardiovascular:     Rate and Rhythm: Normal rate and regular rhythm.     Pulses: Normal pulses.     Heart sounds: Normal heart sounds.  Pulmonary:     Effort: Pulmonary effort is normal.     Breath sounds: Normal breath sounds.  Musculoskeletal:        General: Normal range of motion.  Skin:    General: Skin is warm and dry.  Neurological:     Mental Status: He is alert.  Psychiatric:        Mood and Affect: Mood normal.      UC Treatments / Results  Labs (all labs ordered are listed, but only abnormal results are displayed) Labs Reviewed - No data to display  EKG   Radiology No results found.  Procedures Procedures (including critical care time)  Medications Ordered in UC Medications - No data to display  Initial Impression / Assessment and Plan / UC Course  I have reviewed the triage vital signs and the nursing notes.  Pertinent labs & imaging results that were available during my care of the patient were reviewed by me and considered in my medical decision making (see chart for details).     Sinusitis- pt with 2 weeks of sinus symptoms not resolving. Most likely has a sinus infection.  Will go ahead and treat with amoxicillin  at this time.  Recommend continue over-the-counter medications as needed.  Patient's blood pressure was elevated today.  Reports this is typical for him when coming into a medical facility.  Recommend keep an eye on his blood pressures at home and monitor Final Clinical Impressions(s) / UC Diagnoses   Final diagnoses:  Acute non-recurrent sinusitis,  unspecified location     Discharge Instructions      Take the antibiotics prescribed for sinus infection.  You can continue over-the-counter medication as  needed.  Keep an eye on your blood pressure.  Follow-up as needed    ED Prescriptions     Medication Sig Dispense Auth. Provider   amoxicillin  (AMOXIL ) 875 MG tablet Take 1 tablet (875 mg total) by mouth 2 (two) times daily for 7 days. 14 tablet Adah Wilbert LABOR, FNP      PDMP not reviewed this encounter.   Adah Wilbert LABOR, FNP 02/19/24 [phone]

## 2024-03-05 DIAGNOSIS — E7800 Pure hypercholesterolemia, unspecified: Secondary | ICD-10-CM | POA: Diagnosis not present

## 2024-03-05 DIAGNOSIS — Z0000 Encounter for general adult medical examination without abnormal findings: Secondary | ICD-10-CM | POA: Diagnosis not present

## 2024-03-05 DIAGNOSIS — I10 Essential (primary) hypertension: Secondary | ICD-10-CM | POA: Diagnosis not present

## 2024-03-07 DIAGNOSIS — I10 Essential (primary) hypertension: Secondary | ICD-10-CM | POA: Diagnosis not present

## 2024-03-07 DIAGNOSIS — E7800 Pure hypercholesterolemia, unspecified: Secondary | ICD-10-CM | POA: Diagnosis not present

## 2024-03-07 DIAGNOSIS — R3912 Poor urinary stream: Secondary | ICD-10-CM | POA: Diagnosis not present

## 2024-03-07 DIAGNOSIS — R7303 Prediabetes: Secondary | ICD-10-CM | POA: Diagnosis not present

## 2024-03-07 DIAGNOSIS — Z0000 Encounter for general adult medical examination without abnormal findings: Secondary | ICD-10-CM | POA: Diagnosis not present
